# Patient Record
Sex: Male | Born: 1995 | Race: Black or African American | Hispanic: No | Marital: Single | Smoking: Never smoker
Health system: Southern US, Community
[De-identification: ages and names within clinical notes are randomized; demographics above are authoritative.]

## PROBLEM LIST (undated history)

## (undated) DIAGNOSIS — E271 Primary adrenocortical insufficiency: Secondary | ICD-10-CM

---
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
## 2015-02-13 ENCOUNTER — Encounter: Payer: Self-pay | Admitting: Emergency Medicine

## 2015-02-13 ENCOUNTER — Emergency Department: Payer: Self-pay

## 2015-02-13 ENCOUNTER — Emergency Department
Admission: EM | Admit: 2015-02-13 | Discharge: 2015-02-13 | Disposition: A | Discharge status: Home / Self Care | Payer: Self-pay | Attending: Emergency Medicine | Admitting: Emergency Medicine

## 2015-02-13 DIAGNOSIS — Y9389 Activity, other specified: Secondary | ICD-10-CM | POA: Insufficient documentation

## 2015-02-13 DIAGNOSIS — S8011XA Contusion of right lower leg, initial encounter: Secondary | ICD-10-CM

## 2015-02-13 DIAGNOSIS — W010XXA Fall on same level from slipping, tripping and stumbling without subsequent striking against object, initial encounter: Secondary | ICD-10-CM | POA: Insufficient documentation

## 2015-02-13 DIAGNOSIS — S8001XA Contusion of right knee, initial encounter: Secondary | ICD-10-CM | POA: Insufficient documentation

## 2015-02-13 DIAGNOSIS — Y990 Civilian activity done for income or pay: Secondary | ICD-10-CM | POA: Insufficient documentation

## 2015-02-13 DIAGNOSIS — S7002XA Contusion of left hip, initial encounter: Secondary | ICD-10-CM | POA: Insufficient documentation

## 2015-02-13 DIAGNOSIS — Y9289 Other specified places as the place of occurrence of the external cause: Secondary | ICD-10-CM | POA: Insufficient documentation

## 2015-02-13 DIAGNOSIS — Z7952 Long term (current) use of systemic steroids: Secondary | ICD-10-CM | POA: Insufficient documentation

## 2015-02-13 HISTORY — DX: Primary adrenocortical insufficiency: E27.1

## 2015-02-13 MED ORDER — IBUPROFEN 800 MG PO TABS: 800.0000 mg | ORAL_TABLET | Freq: Three times a day (TID) | ORAL | Status: DC | PRN

## 2015-02-13 NOTE — ED Notes (Signed)
PT slipped and fell at work and now has left hip and right knee pain, ambulatory to triage.  Does not want to file workmans comp.

## 2015-02-13 NOTE — ED Provider Notes (Signed)
[facility] Emergency Department Provider Note  ____________________________________________  Time seen: 7:27 AM  I have reviewed the triage vital signs and the nursing notes.   HISTORY  Chief Complaint Fall   HPI Ralph Ross is a 19 y.o. male states that he fell at work approximately 2:15 AM injuring his right knee and left hip. He states the floor was slippery. He does not want to follow-up with cough. He denies hitting his head or loss of consciousness. He has had problems with his right knee in the past approximately 6 months ago. Denies any hip pain previous to this injury. He has not taken any medication for this. He rates his pain 8 out of 10 at present. Walking is making it worse. Sitting decreases the pain.  Past Medical History  Diagnosis Date  . Addison disease unk    There are no active problems to display for this patient.   History reviewed. No pertinent past surgical history.  Current Outpatient Rx  Name  Route  Sig  Dispense  Refill  . predniSONE (DELTASONE) 2.5 MG tablet   Oral   Take 2.5 mg by mouth 2 (two) times daily with a meal.         . ibuprofen (ADVIL,MOTRIN) 800 MG tablet   Oral   Take 1 tablet (800 mg total) by mouth every 8 (eight) hours as needed.   30 tablet   0     Allergies Review of patient's allergies indicates no known allergies.  History reviewed. No pertinent family history.  Social History History  Substance Use Topics  . Smoking status: Never Smoker   . Smokeless tobacco: Not on file  . Alcohol Use: No    Review of Systems Constitutional: No fever/chills Eyes: No visual changes. ENT: No sore throat. Cardiovascular: Denies chest pain. Respiratory: Denies shortness of breath. Gastrointestinal: No abdominal pain.  No nausea, no vomiting. Genitourinary: Negative for dysuria. Musculoskeletal: Negative for back pain. Skin: Negative for rash. Neurological: Negative for headaches, focal  weakness or numbness.  10-point ROS otherwise negative.  ____________________________________________   PHYSICAL EXAM:  VITAL SIGNS: ED Triage Vitals  Enc Vitals Group     BP 02/13/15 0700 128/86 mmHg     Pulse Rate 02/13/15 0700 65     Resp 02/13/15 0700 18     Temp 02/13/15 0700 98 F (36.7 C)     Temp Source 02/13/15 0700 Oral     SpO2 02/13/15 0700 99 %     Weight 02/13/15 0700 165 lb (74.844 kg)     Height 02/13/15 0700 5\' 8"  (1.727 m)     Head Cir --      Peak Flow --      Pain Score 02/13/15 0701 8     Pain Loc --      Pain Edu? --      Excl. in GC? --     Constitutional: Alert and oriented. Well appearing and in no acute distress. Eyes: Conjunctivae are normal. PERRL. EOMI. Head: Atraumatic. Nose: No congestion/rhinnorhea. Mouth/Throat: Mucous membranes are moist. Neck: No stridor.   Cardiovascular: Normal rate, regular rhythm. Grossly normal heart sounds.  Good peripheral circulation. Respiratory: Normal respiratory effort.  No retractions. Lungs CTAB. Gastrointestinal: Soft and nontender. No distention. No abdominal bruits. No CVA tenderness. Musculoskeletal: Right knee negative for gross deformity. No effusion or restriction in range of motion. There is no crepitus felt.  No joint effusions. Mild tenderness on palpation of the left iliac crest.  No joint discomfort on palpation range of motion is good, no gross deformity. Patient is ambulatory. Neurologic:  Normal speech and language. No gross focal neurologic deficits are appreciated. Speech is normal. No gait instability. Skin:  Skin is warm, dry and intact. No rash noted. Psychiatric: Mood and affect are normal. Speech and behavior are normal.  ____________________________________________   LABS (all labs ordered are listed, but only abnormal results are displayed)  Labs Reviewed - No data to display   RADIOLOGY  The pelvis failed to show any fractures per radiologist. Right knee x-ray showed no  fracture or subluxation per radiologist reading. ____________________________________________   PROCEDURES  Procedure(s) performed: None  Critical Care performed: No  ____________________________________________   INITIAL IMPRESSION / ASSESSMENT AND PLAN / ED COURSE  Pertinent labs & imaging results that were available during my care of the patient were reviewed by me and considered in my medical decision making (see chart for details).  Patient was told x-rays were negative. He request a note for work today, but does not want to follow-up as called. ____________________________________________   FINAL CLINICAL IMPRESSION(S) / ED DIAGNOSES  Final diagnoses:  Contusion, knee and lower leg, right, initial encounter  Contusion, hip, left, initial encounter      Tommi RumpsRhonda L Bobie Caris, PA-C 02/13/15 1014  Governor Rooksebecca Lord, MD 02/13/15 1121

## 2015-02-13 NOTE — Discharge Instructions (Signed)
Contusion °A contusion is a deep bruise. Contusions happen when an injury causes bleeding under the skin. Signs of bruising include pain, puffiness (swelling), and discolored skin. The contusion may turn blue, purple, or yellow. °HOME CARE  °· Put ice on the injured area. °¨ Put ice in a plastic bag. °¨ Place a towel between your skin and the bag. °¨ Leave the ice on for 15-20 minutes, 03-04 times a day. °· Only take medicine as told by your doctor. °· Rest the injured area. °· If possible, raise (elevate) the injured area to lessen puffiness. °GET HELP RIGHT AWAY IF:  °· You have more bruising or puffiness. °· You have pain that is getting worse. °· Your puffiness or pain is not helped by medicine. °MAKE SURE YOU:  °· Understand these instructions. °· Will watch your condition. °· Will get help right away if you are not doing well or get worse. °Document Released: 02/29/2008 Document Revised: 12/05/2011 Document Reviewed: 07/18/2011 °Exit[employer]® Patient Information ©2015 [employer]. This information is not intended to replace advice given to you by your health care provider. Make sure you discuss any questions you have with your health care provider. ° °Cryotherapy °Cryotherapy means treatment with cold. Ice or gel packs can be used to reduce both pain and swelling. Ice is the most helpful within the first 24 to 48 hours after an injury or flare-up from overusing a muscle or joint. Sprains, strains, spasms, burning pain, shooting pain, and aches can all be eased with ice. Ice can also be used when recovering from surgery. Ice is effective, has very few side effects, and is safe for most people to use. °PRECAUTIONS  °Ice is not a safe treatment option for people with: °· Raynaud phenomenon. This is a condition affecting small blood vessels in the extremities. Exposure to cold may cause your problems to return. °· Cold hypersensitivity. There are many forms of cold hypersensitivity, including: °¨ Cold urticaria.  Red, itchy hives appear on the skin when the tissues begin to warm after being iced. °¨ Cold erythema. This is a red, itchy rash caused by exposure to cold. °¨ Cold hemoglobinuria. Red blood cells break down when the tissues begin to warm after being iced. The hemoglobin that carry oxygen are passed into the urine because they cannot combine with blood proteins fast enough. °· Numbness or altered sensitivity in the area being iced. °If you have any of the following conditions, do not use ice until you have discussed cryotherapy with your caregiver: °· Heart conditions, such as arrhythmia, angina, or chronic heart disease. °· High blood pressure. °· Healing wounds or open skin in the area being iced. °· Current infections. °· Rheumatoid arthritis. °· Poor circulation. °· Diabetes. °Ice slows the blood flow in the region it is applied. This is beneficial when trying to stop inflamed tissues from spreading irritating chemicals to surrounding tissues. However, if you expose your skin to cold temperatures for too long or without the proper protection, you can damage your skin or nerves. Watch for signs of skin damage due to cold. °HOME CARE INSTRUCTIONS °Follow these tips to use ice and cold packs safely. °· Place a dry or damp towel between the ice and skin. A damp towel will cool the skin more quickly, so you may need to shorten the time that the ice is used. °· For a more rapid response, add gentle compression to the ice. °· Ice for no more than 10 to 20 minutes at a time.   The bonier the area you are icing, the less time it will take to get the benefits of ice. °· Check your skin after 5 minutes to make sure there are no signs of a poor response to cold or skin damage. °· Rest 20 minutes or more between uses. °· Once your skin is numb, you can end your treatment. You can test numbness by very lightly touching your skin. The touch should be so light that you do not see the skin dimple from the pressure of your  fingertip. When using ice, most people will feel these normal sensations in this order: cold, burning, aching, and numbness. °· Do not use ice on someone who cannot communicate their responses to pain, such as small children or people with dementia. °HOW TO MAKE AN ICE PACK °Ice packs are the most common way to use ice therapy. Other methods include ice massage, ice baths, and cryosprays. Muscle creams that cause a cold, tingly feeling do not offer the same benefits that ice offers and should not be used as a substitute unless recommended by your caregiver. °To make an ice pack, do one of the following: °· Place crushed ice or a bag of frozen vegetables in a sealable plastic bag. Squeeze out the excess air. Place this bag inside another plastic bag. Slide the bag into a pillowcase or place a damp towel between your skin and the bag. °· Mix 3 parts water with 1 part rubbing alcohol. Freeze the mixture in a sealable plastic bag. When you remove the mixture from the freezer, it will be slushy. Squeeze out the excess air. Place this bag inside another plastic bag. Slide the bag into a pillowcase or place a damp towel between your skin and the bag. °SEEK MEDICAL CARE IF: °· You develop white spots on your skin. This may give the skin a blotchy (mottled) appearance. °· Your skin turns blue or pale. °· Your skin becomes waxy or hard. °· Your swelling gets worse. °MAKE SURE YOU:  °· Understand these instructions. °· Will watch your condition. °· Will get help right away if you are not doing well or get worse. °Document Released: 05/09/2011 Document Revised: 01/27/2014 Document Reviewed: 05/09/2011 °ExitCare® Patient Information ©2015 [employer]. This information is not intended to replace advice given to you by your health care provider. Make sure you discuss any questions you have with your health care provider. ° °

## 2015-03-08 ENCOUNTER — Emergency Department: Payer: Self-pay

## 2015-03-08 ENCOUNTER — Emergency Department
Admission: EM | Admit: 2015-03-08 | Discharge: 2015-03-08 | Disposition: A | Discharge status: Home / Self Care | Payer: Self-pay | Attending: Emergency Medicine | Admitting: Emergency Medicine

## 2015-03-08 ENCOUNTER — Encounter: Payer: Self-pay | Admitting: *Deleted

## 2015-03-08 DIAGNOSIS — Y998 Other external cause status: Secondary | ICD-10-CM | POA: Insufficient documentation

## 2015-03-08 DIAGNOSIS — S161XXA Strain of muscle, fascia and tendon at neck level, initial encounter: Secondary | ICD-10-CM | POA: Insufficient documentation

## 2015-03-08 DIAGNOSIS — Y9389 Activity, other specified: Secondary | ICD-10-CM | POA: Insufficient documentation

## 2015-03-08 DIAGNOSIS — Y92410 Unspecified street and highway as the place of occurrence of the external cause: Secondary | ICD-10-CM | POA: Insufficient documentation

## 2015-03-08 DIAGNOSIS — Z7952 Long term (current) use of systemic steroids: Secondary | ICD-10-CM | POA: Insufficient documentation

## 2015-03-08 DIAGNOSIS — S0990XA Unspecified injury of head, initial encounter: Secondary | ICD-10-CM | POA: Insufficient documentation

## 2015-03-08 MED ORDER — OXYCODONE-ACETAMINOPHEN 5-325 MG PO TABS
ORAL_TABLET | ORAL | Status: AC
  Administered 2015-03-08: 1 via ORAL
  Filled 2015-03-08: qty 1

## 2015-03-08 MED ORDER — OXYCODONE-ACETAMINOPHEN 5-325 MG PO TABS
1.0000 | ORAL_TABLET | Freq: Once | ORAL | Status: AC
  Administered 2015-03-08: 1 via ORAL

## 2015-03-08 MED ORDER — ONDANSETRON 4 MG PO TBDP
ORAL_TABLET | ORAL | Status: AC
  Administered 2015-03-08: 4 mg via ORAL
  Filled 2015-03-08: qty 1

## 2015-03-08 MED ORDER — HYDROCODONE-ACETAMINOPHEN 5-325 MG PO TABS: 1.0000 | ORAL_TABLET | Freq: Four times a day (QID) | ORAL | Status: DC | PRN

## 2015-03-08 MED ORDER — ONDANSETRON 4 MG PO TBDP
4.0000 mg | ORAL_TABLET | Freq: Once | ORAL | Status: AC
  Administered 2015-03-08: 4 mg via ORAL
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
## 2015-03-08 NOTE — Discharge Instructions (Signed)
1. Take medicines as needed for pain (Motrin/Norco #15). 2. Apply moist heat to affected area several times daily. 3. Return to the ER for worsening symptoms, persistent vomiting, difficulty breathing or other concerns.   Motor Vehicle Collision It is common to have multiple bruises and sore muscles after a motor vehicle collision (MVC). These tend to feel worse for the first 24 hours. You may have the most stiffness and soreness over the first several hours. You may also feel worse when you wake up the first morning after your collision. After this point, you will usually begin to improve with each day. The speed of improvement often depends on the severity of the collision, the number of injuries, and the location and nature of these injuries. HOME CARE INSTRUCTIONS  Put ice on the injured area.  Put ice in a plastic bag.  Place a towel between your skin and the bag.  Leave the ice on for 15-20 minutes, 3-4 times a day, or as directed by your health care provider.  Drink enough fluids to keep your urine clear or pale yellow. Do not drink alcohol.  Take a warm shower or bath once or twice a day. This will increase blood flow to sore muscles.  You may return to activities as directed by your caregiver. Be careful when lifting, as this may aggravate neck or back pain.  Only take over-the-counter or prescription medicines for pain, discomfort, or fever as directed by your caregiver. Do not use aspirin. This may increase bruising and bleeding. SEEK IMMEDIATE MEDICAL CARE IF:  You have numbness, tingling, or weakness in the arms or legs.  You develop severe headaches not relieved with medicine.  You have severe neck pain, especially tenderness in the middle of the back of your neck.  You have changes in bowel or bladder control.  There is increasing pain in any area of the body.  You have shortness of breath, light-headedness, dizziness, or fainting.  You have chest pain.  You  feel sick to your stomach (nauseous), throw up (vomit), or sweat.  You have increasing abdominal discomfort.  There is blood in your urine, stool, or vomit.  You have pain in your shoulder (shoulder strap areas).  You feel your symptoms are getting worse. MAKE SURE YOU:  Understand these instructions.  Will watch your condition.  Will get help right away if you are not doing well or get worse. Document Released: 09/12/2005 Document Revised: 01/27/2014 Document Reviewed: 02/09/2011 [facility] Patient Information 2015 [place], [place]. This information is not intended to replace advice given to you by your health care provider. Make sure you discuss any questions you have with your health care provider.  Cervical Sprain A cervical sprain is an injury in the neck in which the strong, fibrous tissues (ligaments) that connect your neck bones stretch or tear. Cervical sprains can range from mild to severe. Severe cervical sprains can cause the neck vertebrae to be unstable. This can lead to damage of the spinal cord and can result in serious nervous system problems. The amount of time it takes for a cervical sprain to get better depends on the cause and extent of the injury. Most cervical sprains heal in 1 to 3 weeks. CAUSES  Severe cervical sprains may be caused by:   Contact sport injuries (such as from football, rugby, wrestling, hockey, auto racing, gymnastics, diving, martial arts, or boxing).   Motor vehicle collisions.   Whiplash injuries. This is an injury from a sudden forward and  backward whipping movement of the head and neck.  Falls.  Mild cervical sprains may be caused by:   Being in an awkward position, such as while cradling a telephone between your ear and shoulder.   Sitting in a chair that does not offer proper support.   Working at a poorly Marketing executive station.   Looking up or down for long periods of time.  SYMPTOMS   Pain, soreness, stiffness, or a  burning sensation in the front, back, or sides of the neck. This discomfort may develop immediately after the injury or slowly, 24 hours or more after the injury.   Pain or tenderness directly in the middle of the back of the neck.   Shoulder or upper back pain.   Limited ability to move the neck.   Headache.   Dizziness.   Weakness, numbness, or tingling in the hands or arms.   Muscle spasms.   Difficulty swallowing or chewing.   Tenderness and swelling of the neck.  DIAGNOSIS  Most of the time your health care provider can diagnose a cervical sprain by taking your history and doing a physical exam. Your health care provider will ask about previous neck injuries and any known neck problems, such as arthritis in the neck. X-rays may be taken to find out if there are any other problems, such as with the bones of the neck. Other tests, such as a CT scan or MRI, may also be needed.  TREATMENT  Treatment depends on the severity of the cervical sprain. Mild sprains can be treated with rest, keeping the neck in place (immobilization), and pain medicines. Severe cervical sprains are immediately immobilized. Further treatment is done to help with pain, muscle spasms, and other symptoms and may include:  Medicines, such as pain relievers, numbing medicines, or muscle relaxants.   Physical therapy. This may involve stretching exercises, strengthening exercises, and posture training. Exercises and improved posture can help stabilize the neck, strengthen muscles, and help stop symptoms from returning.  HOME CARE INSTRUCTIONS   Put ice on the injured area.   Put ice in a plastic bag.   Place a towel between your skin and the bag.   Leave the ice on for 15-20 minutes, 3-4 times a day.   If your injury was severe, you may have been given a cervical collar to wear. A cervical collar is a two-piece collar designed to keep your neck from moving while it heals.  Do not remove the  collar unless instructed by your health care provider.  If you have long hair, keep it outside of the collar.  Ask your health care provider before making any adjustments to your collar. Minor adjustments may be required over time to improve comfort and reduce pressure on your chin or on the back of your head.  Ifyou are allowed to remove the collar for cleaning or bathing, follow your health care provider's instructions on how to do so safely.  Keep your collar clean by wiping it with mild soap and water and drying it completely. If the collar you have been given includes removable pads, remove them every 1-2 days and hand wash them with soap and water. Allow them to air dry. They should be completely dry before you wear them in the collar.  If you are allowed to remove the collar for cleaning and bathing, wash and dry the skin of your neck. Check your skin for irritation or sores. If you see any, tell your health care  provider.  Do not drive while wearing the collar.   Only take over-the-counter or prescription medicines for pain, discomfort, or fever as directed by your health care provider.   Keep all follow-up appointments as directed by your health care provider.   Keep all physical therapy appointments as directed by your health care provider.   Make any needed adjustments to your workstation to promote good posture.   Avoid positions and activities that make your symptoms worse.   Warm up and stretch before being active to help prevent problems.  SEEK MEDICAL CARE IF:   Your pain is not controlled with medicine.   You are unable to decrease your pain medicine over time as planned.   Your activity level is not improving as expected.  SEEK IMMEDIATE MEDICAL CARE IF:   You develop any bleeding.  You develop stomach upset.  You have signs of an allergic reaction to your medicine.   Your symptoms get worse.   You develop new, unexplained symptoms.   You  have numbness, tingling, weakness, or paralysis in any part of your body.  MAKE SURE YOU:   Understand these instructions.  Will watch your condition.  Will get help right away if you are not doing well or get worse. Document Released: 07/10/2007 Document Revised: 09/17/2013 Document Reviewed: 03/20/2013 [facility] Patient Information 2015 [place], [place]. This information is not intended to replace advice given to you by your health care provider. Make sure you discuss any questions you have with your health care provider.

## 2015-03-08 NOTE — ED Notes (Signed)
Patient transported to CT 
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
## 2015-06-14 ENCOUNTER — Emergency Department
Admission: EM | Admit: 2015-06-14 | Discharge: 2015-06-14 | Disposition: A | Discharge status: Home / Self Care | Payer: Self-pay | Attending: Emergency Medicine | Admitting: Emergency Medicine

## 2015-06-14 DIAGNOSIS — X58XXXA Exposure to other specified factors, initial encounter: Secondary | ICD-10-CM | POA: Insufficient documentation

## 2015-06-14 DIAGNOSIS — Y92310 Basketball court as the place of occurrence of the external cause: Secondary | ICD-10-CM | POA: Insufficient documentation

## 2015-06-14 DIAGNOSIS — S299XXA Unspecified injury of thorax, initial encounter: Secondary | ICD-10-CM | POA: Insufficient documentation

## 2015-06-14 DIAGNOSIS — Y990 Civilian activity done for income or pay: Secondary | ICD-10-CM | POA: Insufficient documentation

## 2015-06-14 DIAGNOSIS — Y9367 Activity, basketball: Secondary | ICD-10-CM | POA: Insufficient documentation

## 2015-06-14 DIAGNOSIS — S46911A Strain of unspecified muscle, fascia and tendon at shoulder and upper arm level, right arm, initial encounter: Secondary | ICD-10-CM | POA: Insufficient documentation

## 2015-06-14 DIAGNOSIS — Z79899 Other long term (current) drug therapy: Secondary | ICD-10-CM | POA: Insufficient documentation

## 2015-06-14 MED ORDER — NAPROXEN 500 MG PO TBEC: 500.0000 mg | DELAYED_RELEASE_TABLET | Freq: Two times a day (BID) | ORAL | Status: DC

## 2015-06-14 NOTE — Discharge Instructions (Signed)
Shoulder Sprain  A shoulder sprain is the result of damage to the tough, fiber-like tissues (ligaments) that help hold your shoulder in place. The ligaments may be stretched or torn. Besides the main shoulder joint (the ball and socket), there are several smaller joints that connect the bones in this area. A sprain usually involves one of those joints. Most often it is the acromioclavicular (or AC) joint. That is the joint that connects the collarbone (clavicle) and the shoulder blade (scapula) at the top point of the shoulder blade (acromion).  A shoulder sprain is a mild form of what is called a shoulder separation. Recovering from a shoulder sprain may take some time. For some, pain lingers for several months. Most people recover without long term problems.  CAUSES    A shoulder sprain is usually caused by some kind of trauma. This might be:   Falling on an outstretched arm.   Being hit hard on the shoulder.   Twisting the arm.   Shoulder sprains are more likely to occur in people who:   Play sports.   Have balance or coordination problems.  SYMPTOMS    Pain when you move your shoulder.   Limited ability to move the shoulder.   Swelling and tenderness on top of the shoulder.   Redness or warmth in the shoulder.   Bruising.   A change in the shape of the shoulder.  DIAGNOSIS   Your healthcare provider may:   Ask about your symptoms.   Ask about recent activity that might have caused those symptoms.   Examine your shoulder. You may be asked to do simple exercises to test movement. The other shoulder will be examined for comparison.   Order some tests that provide a look inside the body. They can show the extent of the injury. The tests could include:   X-rays.   CT (computed tomography) scan.   MRI (magnetic resonance imaging) scan.  RISKS AND COMPLICATIONS   Loss of full shoulder motion.   Ongoing shoulder pain.  TREATMENT   How long it takes to recover from a shoulder sprain depends on how  severe it was. Treatment options may include:   Rest. You should not use the arm or shoulder until it heals.   Ice. For 2 or 3 days after the injury, put an ice pack on the shoulder up to 4 times a day. It should stay on for 15 to 20 minutes each time. Wrap the ice in a towel so it does not touch your skin.   Over-the-counter medicine to relieve pain.   A sling or brace. This will keep the arm still while the shoulder is healing.   Physical therapy or rehabilitation exercises. These will help you regain strength and motion. Ask your healthcare provider when it is OK to begin these exercises.   Surgery. The need for surgery is rare with a sprained shoulder, but some people may need surgery to keep the joint in place and reduce pain.  HOME CARE INSTRUCTIONS    Ask your healthcare provider about what you should and should not do while your shoulder heals.   Make sure you know how to apply ice to the correct area of your shoulder.   Talk with your healthcare provider about which medications should be used for pain and swelling.   If rehabilitation therapy will be needed, ask your healthcare provider to refer you to a therapist. If it is not recommended, then ask about at-home exercises. Find   Revised: 12/05/2011 Document Reviewed: 01/29/2009 [employer] Patient Information 2015 [place], [place]. This information is not intended to replace advice given to you by your health care provider. Make sure you discuss any questions you have with your health care provider.  Muscle Strain A muscle strain is an injury that occurs when a muscle is stretched beyond its normal length. Usually a small number of muscle fibers are torn when this happens. Muscle strain is rated in  degrees. First-degree strains have the least amount of muscle fiber tearing and pain. Second-degree and third-degree strains have increasingly more tearing and pain.  Usually, recovery from muscle strain takes 1-2 weeks. Complete healing takes 5-6 weeks.  CAUSES  Muscle strain happens when a sudden, violent force placed on a muscle stretches it too far. This may occur with lifting, sports, or a fall.  RISK FACTORS Muscle strain is especially common in athletes.  SIGNS AND SYMPTOMS At the site of the muscle strain, there may be:  Pain.  Bruising.  Swelling.  Difficulty using the muscle due to pain or lack of normal function. DIAGNOSIS  Your health care provider will perform a physical exam and ask about your medical history. TREATMENT  Often, the best treatment for a muscle strain is resting, icing, and applying cold compresses to the injured area.  HOME CARE INSTRUCTIONS   Use the PRICE method of treatment to promote muscle healing during the first 2-3 days after your injury. The PRICE method involves:  Protecting the muscle from being injured again.  Restricting your activity and resting the injured body part.  Icing your injury. To do this, put ice in a plastic bag. Place a towel between your skin and the bag. Then, apply the ice and leave it on from 15-20 minutes each hour. After the third day, switch to moist heat packs.  Apply compression to the injured area with a splint or elastic bandage. Be careful not to wrap it too tightly. This may interfere with blood circulation or increase swelling.  Elevate the injured body part above the level of your heart as often as you can.  Only take over-the-counter or prescription medicines for pain, discomfort, or fever as directed by your health care provider.  Warming up prior to exercise helps to prevent future muscle strains. SEEK MEDICAL CARE IF:   You have increasing pain or swelling in the injured area.  You have numbness,  tingling, or a significant loss of strength in the injured area. MAKE SURE YOU:   Understand these instructions.  Will watch your condition.  Will get help right away if you are not doing well or get worse. Document Released: 09/12/2005 Document Revised: 07/03/2013 Document Reviewed: 04/11/2013 [facility] Patient Information 2015 [place], [place]. This information is not intended to replace advice given to you by your health care provider. Make sure you discuss any questions you have with your health care provider.  Your exam is normal except for some mild muscle strain and tenderness over the trapezius and shoulder blade muscles.  You should apply ice and take the prescription Naproxen as directed.  Follow-up with [employer] as needed.

## 2015-06-14 NOTE — ED Notes (Signed)
Pt c/o right anterior shoulder pain and tingling. Onset 3 days ago while at work.   He reports that he played basketball the day before the pain started but does not remember a specific injury.  Last night patients reports that he had a shooting pain from his shoulder to right side of neck.

## 2015-06-14 NOTE — ED Provider Notes (Signed)
[facility] Emergency Department Provider Note ____________________________________________  Time seen: 1601  I have reviewed the triage vital signs and the nursing notes.  HISTORY  Chief Complaint  Shoulder Pain  HPI Ralph Ross is a 19 y.o. male reports to the ED with anterior right shoulder pain and tingling. He describes onset about 3 days prior while at work. He denies any particular injury, trauma, fall. He does not give a history of shoulder pain in the past.He localizes to pain to the trapezius and upper scapular region on the right side of his shoulder. He has applied heat for comfort without significant change in his symptoms. He works for [employer], Occupational hygienist.   Past Medical History  Diagnosis Date  . Addison disease unk   There are no active problems to display for this patient.  No past surgical history on file.  Current Outpatient Rx  Name  Route  Sig  Dispense  Refill  . fludrocortisone (FLORINEF) 0.1 MG tablet   Oral   Take 0.1 mg by mouth daily.         Marland Kitchen HYDROcodone-acetaminophen (NORCO) 5-325 MG per tablet   Oral   Take 1 tablet by mouth every 6 (six) hours as needed for moderate pain.   15 tablet   0   . ibuprofen (ADVIL,MOTRIN) 800 MG tablet   Oral   Take 1 tablet (800 mg total) by mouth every 8 (eight) hours as needed. Patient not taking: Reported on 03/08/2015   30 tablet   0   . naproxen (EC NAPROSYN) 500 MG EC tablet   Oral   Take 1 tablet (500 mg total) by mouth 2 (two) times daily with a meal.   30 tablet   0   . predniSONE (DELTASONE) 2.5 MG tablet   Oral   Take 2.5 mg by mouth 2 (two) times daily with a meal.          Allergies Review of patient's allergies indicates no known allergies.  No family history on file.  Social History Social History  Substance Use Topics  . Smoking status: Never Smoker   . Smokeless tobacco: Never Used  . Alcohol Use: No   Review of  Systems  Constitutional: Negative for fever. Eyes: Negative for visual changes. ENT: Negative for sore throat. Cardiovascular: Negative for chest pain. Respiratory: Negative for shortness of breath. Gastrointestinal: Negative for abdominal pain, vomiting and diarrhea. Genitourinary: Negative for dysuria. Musculoskeletal: Positive for upper back pain. Skin: Negative for rash. Neurological: Negative for headaches, focal weakness or numbness. ____________________________________________  PHYSICAL EXAM:  VITAL SIGNS: ED Triage Vitals  Enc Vitals Group     BP 06/14/15 1620 146/99 mmHg     Pulse Rate 06/14/15 1620 61     Resp 06/14/15 1620 18     Temp 06/14/15 1620 97.8 F (36.6 C)     Temp Source 06/14/15 1620 Oral     SpO2 06/14/15 1620 98 %     Weight 06/14/15 1620 158 lb (71.668 kg)     Height 06/14/15 1620  (1.727 m)     Head Cir --      Peak Flow --      Pain Score 06/14/15 1620 4     Pain Loc --      Pain Edu? --      Excl. in GC? --    Constitutional: Alert and oriented. Well appearing and in no distress. Eyes: Conjunctivae are normal. PERRL. Normal extraocular  movements. ENT   Head: Normocephalic and atraumatic.   Nose: No congestion/rhinorrhea.   Mouth/Throat: Mucous membranes are moist.   Neck: Supple. No thyromegaly. Hematological/Lymphatic/Immunological: No cervical lymphadenopathy. Cardiovascular: Normal rate, regular rhythm.  Respiratory: Normal respiratory effort. No wheezes/rales/rhonchi. Gastrointestinal: Soft and nontender. No distention. Musculoskeletal: Normal spinal alignment without spasm, deformity, or step-off. Evaluation of the chest and shortness does not reveal any obvious deformity, swelling, or scapular winging. Patient with full normal range of the right upper extremity without difficulty. Normal rotator cuff testing is elicited. He is minimally tender to palpation over the right upper trapezius as well as the supraspinous  musculature. Normal composite fist and grips distally. Nontender with normal range of motion in all extremities.  Neurologic:  Normal gait without ataxia. Normal speech and language. No gross focal neurologic deficits are appreciated. Skin:  Skin is warm, dry and intact. No rash noted. Psychiatric: Mood and affect are normal. Patient exhibits appropriate insight and judgment. ________________________________________________________  INITIAL IMPRESSION / ASSESSMENT AND PLAN / ED COURSE  Right anterior shoulder scapular strain without indication of rotator cuff injury. 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 Follow-up with [employer] as needed.  ____________________________________________  FINAL CLINICAL IMPRESSION(S) / ED DIAGNOSES  Final diagnoses:  Shoulder strain, right, initial encounter      Lissa Hoard, PA-C 06/14/15 1749  Emily Filbert, MD 06/14/15 [phone]

## 2015-11-16 ENCOUNTER — Emergency Department
Admission: EM | Admit: 2015-11-16 | Discharge: 2015-11-16 | Disposition: A | Discharge status: Home / Self Care | Payer: No Typology Code available for payment source | Attending: Emergency Medicine | Admitting: Emergency Medicine

## 2015-11-16 ENCOUNTER — Encounter: Payer: Self-pay | Admitting: Emergency Medicine

## 2015-11-16 DIAGNOSIS — Z791 Long term (current) use of non-steroidal anti-inflammatories (NSAID): Secondary | ICD-10-CM | POA: Insufficient documentation

## 2015-11-16 DIAGNOSIS — E2740 Unspecified adrenocortical insufficiency: Secondary | ICD-10-CM | POA: Insufficient documentation

## 2015-11-16 DIAGNOSIS — Z79899 Other long term (current) drug therapy: Secondary | ICD-10-CM | POA: Insufficient documentation

## 2015-11-16 DIAGNOSIS — Z7952 Long term (current) use of systemic steroids: Secondary | ICD-10-CM | POA: Insufficient documentation

## 2015-11-16 LAB — COMPREHENSIVE METABOLIC PANEL
ALK PHOS: 95 U/L (ref 38–126)
ALT: 16 U/L — AB (ref 17–63)
AST: 22 U/L (ref 15–41)
Albumin: 4 g/dL (ref 3.5–5.0)
Anion gap: 5 (ref 5–15)
BUN: 15 mg/dL (ref 6–20)
CALCIUM: 9.2 mg/dL (ref 8.9–10.3)
CHLORIDE: 108 mmol/L (ref 101–111)
CO2: 27 mmol/L (ref 22–32)
Creatinine, Ser: 0.84 mg/dL (ref 0.61–1.24)
GFR calc Af Amer: >60 mL/min (ref >60)
Glucose, Bld: 105 mg/dL — ABNORMAL HIGH (ref 65–99)
Potassium: 3.9 mmol/L (ref 3.5–5.1)
Sodium: 140 mmol/L (ref 135–145)
Total Bilirubin: 0.4 mg/dL (ref 0.3–1.2)
Total Protein: 6.7 g/dL (ref 6.5–8.1)

## 2015-11-16 LAB — CBC
HCT: 41.6 % (ref 40.0–52.0)
Hemoglobin: 14.2 g/dL (ref 13.0–18.0)
MCH: 30.6 pg (ref 26.0–34.0)
MCHC: 34.2 g/dL (ref 32.0–36.0)
MCV: 89.4 fL (ref 80.0–100.0)
Platelets: 205 10*3/uL (ref 150–440)
RBC: 4.66 MIL/uL (ref 4.40–5.90)
RDW: 13.3 % (ref 11.5–14.5)
WBC: 7.3 10*3/uL (ref 3.8–10.6)

## 2015-11-16 LAB — LIPASE, BLOOD: LIPASE: 24 U/L (ref 11–51)

## 2015-11-16 MED ORDER — PREDNISONE 5 MG PO TABS
7.5000 mg | ORAL_TABLET | ORAL | Status: AC
  Administered 2015-11-16: 7.5 mg via ORAL
  Filled 2015-11-16: qty 1

## 2015-11-16 MED ORDER — FLUDROCORTISONE ACETATE 0.1 MG PO TABS
0.1000 mg | ORAL_TABLET | ORAL | Status: AC
  Administered 2015-11-16: 0.1 mg via ORAL
  Filled 2015-11-16: qty 1

## 2015-11-16 MED ORDER — SODIUM CHLORIDE 0.9 % IV BOLUS (SEPSIS)
1000.0000 mL | Freq: Once | INTRAVENOUS | Status: AC
  Administered 2015-11-16: 1000 mL via INTRAVENOUS

## 2015-11-16 NOTE — Discharge Instructions (Signed)
Please follow up closely with [facility] endocrinology. Please call your physician there to set up close follow-up.  Please increase your dose of prednisone to 7.5 mg for the next 48 hours. Then continue your normal dosing.  Please take your Florinef as previously prescribed.  Return to the emergency room right away if you develop a fever, vomiting, weakness, abdominal pain, feel your symptoms such as severe nausea or weakness returning, or other new concerns arise.

## 2015-11-16 NOTE — ED Provider Notes (Signed)
[facility] Emergency Department Provider Note  ____________________________________________  Time seen: Approximately 11:39 PM  I have reviewed the triage vital signs and the nursing notes.   HISTORY  Chief Complaint Emesis    HPI Ralph Ross is a 20 y.o. male history of primary adrenal insufficiency. Patient reports that he was out of medicine for 2 days because he visited [place]. He did not take his medicines with him this morning he started to develop nausea. No pain. No fevers chills chest pain and trouble breathing. He otherwise feels okay except for feeling generally fatigued. He did vomit once.  Denies abdominal pain.  Follows with [facility] pediatric endocrinology.  Past Medical History  Diagnosis Date  . Addison disease (HCC) unk    There are no active problems to display for this patient.   History reviewed. No pertinent past surgical history.  Current Outpatient Rx  Name  Route  Sig  Dispense  Refill  . fludrocortisone (FLORINEF) 0.1 MG tablet   Oral   Take 0.1 mg by mouth daily.         Marland Kitchen HYDROcodone-acetaminophen (NORCO) 5-325 MG per tablet   Oral   Take 1 tablet by mouth every 6 (six) hours as needed for moderate pain.   15 tablet   0   . ibuprofen (ADVIL,MOTRIN) 800 MG tablet   Oral   Take 1 tablet (800 mg total) by mouth every 8 (eight) hours as needed. Patient not taking: Reported on 03/08/2015   30 tablet   0   . naproxen (EC NAPROSYN) 500 MG EC tablet   Oral   Take 1 tablet (500 mg total) by mouth 2 (two) times daily with a meal.   30 tablet   0   . predniSONE (DELTASONE) 2.5 MG tablet   Oral   Take 2.5 mg by mouth 2 (two) times daily with a meal.           Allergies Review of patient's allergies indicates no known allergies.  History reviewed. No pertinent family history.  Social History Social History  Substance Use Topics  . Smoking status: Never Smoker   . Smokeless tobacco: Never Used  .  Alcohol Use: No    Review of Systems Constitutional: No fever/chills Eyes: No visual changes. ENT: No sore throat. Cardiovascular: Denies chest pain. Respiratory: Denies shortness of breath. Gastrointestinal: No abdominal pain.   No diarrhea.  No constipation. Genitourinary: Negative for dysuria. Musculoskeletal: Negative for back pain. Skin: Negative for rash. Neurological: Negative for headaches, focal weakness or numbness.  10-point ROS otherwise negative.  ____________________________________________   PHYSICAL EXAM:  VITAL SIGNS: ED Triage Vitals  Enc Vitals Group     BP 11/16/15 2120 119/73 mmHg     Pulse Rate 11/16/15 2120 57     Resp 11/16/15 2120 18     Temp 11/16/15 2120 98.7 F (37.1 C)     Temp Source 11/16/15 2120 Oral     SpO2 11/16/15 2120 97 %     Weight 11/16/15 2120 162 lb (73.483 kg)     Height 11/16/15 2120  (1.727 m)     Head Cir --      Peak Flow --      Pain Score 11/16/15 2121 7     Pain Loc --      Pain Edu? --      Excl. in GC? --    Constitutional: Alert and oriented. Well appearing and in no acute distress. Patient reports  he feels better and would like something to eat. Eyes: Conjunctivae are normal. PERRL. EOMI. Head: Atraumatic. Nose: No congestion/rhinnorhea. Mouth/Throat: Mucous membranes are moist.  Oropharynx non-erythematous. Neck: No stridor.   Cardiovascular: Normal rate, regular rhythm. Grossly normal heart sounds.  Good peripheral circulation. Respiratory: Normal respiratory effort.  No retractions. Lungs CTAB. Gastrointestinal: Soft and nontender. No distention. No rebound or guarding. No epigastric pain. Patient denies any abdominal pain on exam at this time. No right lower quadrant pain. Musculoskeletal: No lower extremity tenderness nor edema.   Neurologic:  Normal speech and language. No gross focal neurologic deficits are appreciated. Skin:  Skin is warm, dry and intact. No rash noted. Psychiatric: Mood and affect  are normal. Speech and behavior are normal.  ____________________________________________   LABS (all labs ordered are listed, but only abnormal results are displayed)  Labs Reviewed  COMPREHENSIVE METABOLIC PANEL - Abnormal; Notable for the following:    Glucose, Bld 105 (*)    ALT 16 (*)    All other components within normal limits  CBC  LIPASE, BLOOD   ____________________________________________  EKG   ____________________________________________  RADIOLOGY   ____________________________________________   PROCEDURES  Procedure(s) performed: None  Critical Care performed: No  ____________________________________________   INITIAL IMPRESSION / ASSESSMENT AND PLAN / ED COURSE  Pertinent labs & imaging results that were available during my care of the patient were reviewed by me and considered in my medical decision making (see chart for details).  Patient presents for nausea with one episode of vomiting and generalized fatigue after missing his steroids for the last 48 hours. He did not run out of medicine but simply did not bring them with him when he traveled. He is followed by pediatric endocrinology, I discussed his case with Dr. Laveda Norman the on-call endocrinologist. He advises symptoms do appear to be consistent with missing his medications advises tripling prednisone dose for the next 48 hours and maintaining his normal Florinef dosing.  Labs reviewed and are reassuring. Patient given Florinef and prednisone. Patient reports all of his symptoms have resolved and he feels well at this time.  Plan of care, medication adjustment for the next 48 hours discussed with patient and mother are both agreeable. He will call [facility] endocrinology for close follow-up. Careful return precautions advised.  No evidence to support need for CT imaging. No fever. No right lower quadrant tenderness. No peritonitis. He reports all symptoms  resolved ____________________________________________   FINAL CLINICAL IMPRESSION(S) / ED DIAGNOSES  Final diagnoses:  Adrenal insufficiency (HCC)      Sharyn Creamer, MD 11/16/15 2348

## 2015-11-16 NOTE — ED Notes (Signed)
Pt arrived to the ED for complaints of generalized malaise, vomiting and weakness secondary to not taking his hormone replacement medication for 2 days. Pt's mother states that the Pt suffers of congenital adrenal hypoplasia and has not take his medication in 2 days and they are afraid that the Pt is going into an adrenal crisis. Pt is AOx4 feeling tired in triage.

## 2015-12-04 ENCOUNTER — Emergency Department: Payer: Self-pay

## 2015-12-04 ENCOUNTER — Emergency Department
Admission: EM | Admit: 2015-12-04 | Discharge: 2015-12-04 | Disposition: A | Discharge status: Home / Self Care | Payer: Self-pay | Attending: Emergency Medicine | Admitting: Emergency Medicine

## 2015-12-04 ENCOUNTER — Encounter: Payer: Self-pay | Admitting: Emergency Medicine

## 2015-12-04 DIAGNOSIS — E2740 Unspecified adrenocortical insufficiency: Secondary | ICD-10-CM | POA: Insufficient documentation

## 2015-12-04 DIAGNOSIS — M25561 Pain in right knee: Secondary | ICD-10-CM | POA: Insufficient documentation

## 2015-12-04 MED ORDER — MELOXICAM 15 MG PO TABS: 15.0000 mg | ORAL_TABLET | Freq: Every day | ORAL | Status: DC

## 2015-12-04 NOTE — Discharge Instructions (Signed)
How to Use a Knee Brace °A knee brace is a device that you wear to support your knee, especially if the knee is healing after an injury or surgery. There are several types of knee braces. Some are designed to prevent an injury (prophylactic brace). These are often worn during sports. Others support an injured knee (functional brace) or keep it still while it heals (rehabilitative brace). People with severe arthritis of the knee may benefit from a brace that takes some pressure off the knee (unloader brace). Most knee braces are made from a combination of cloth and metal or plastic.  °You may need to wear a knee brace to: °· Relieve knee pain. °· Help your knee support your weight (improve stability). °· Help you walk farther (improve mobility). °· Prevent injury. °· Support your knee while it heals from surgery or from an injury. °RISKS AND COMPLICATIONS °Generally, knee braces are very safe to wear. However, problems may occur, including: °· Skin irritation that may lead to infection. °· Making your condition worse if you wear the brace in the wrong way. °HOW TO USE A KNEE BRACE °Different braces will have different instructions for use. Your health care provider will tell you or show you: °· How to put on your brace. °· How to adjust the brace. °· When and how often to wear the brace. °· How to remove the brace. °· If you will need any assistive devices in addition to the brace, such as crutches or a cane. °In general, your brace should: °· Have the hinge of the brace line up with the bend of your knee. °· Have straps, hooks, or tapes that fasten snugly around your leg. °· Not feel too tight or too loose.  °HOW TO CARE FOR A KNEE BRACE °· Check your brace often for signs of damage, such as loose connections or attachments. Your knee brace may get damaged or wear out during normal use. °· Wash the fabric parts of your brace with soap and water. °· Read the insert that comes with your brace for other specific care  instructions.  °SEEK MEDICAL CARE IF: °· Your knee brace is too loose or too tight and you cannot adjust it. °· Your knee brace causes skin redness, swelling, bruising, or irritation. °· Your knee brace is not helping. °· Your knee brace is making your knee pain worse. °  °This information is not intended to replace advice given to you by your health care provider. Make sure you discuss any questions you have with your health care provider. °  °Document Released: 12/03/2003 Document Revised: 06/03/2015 Document Reviewed: 01/05/2015 °Elsevier Interactive Patient Education ©2016 [employer] °Knee Pain °Knee pain is a very common symptom and can have many causes. Knee pain often goes away when you follow your health care provider's instructions for relieving pain and discomfort at home. However, knee pain can develop into a condition that needs treatment. Some conditions may include: °· Arthritis caused by wear and tear (osteoarthritis). °· Arthritis caused by swelling and irritation (rheumatoid arthritis or gout). °· A cyst or growth in your knee. °· An infection in your knee joint. °· An injury that will not heal. °· Damage, swelling, or irritation of the tissues that support your knee (torn ligaments or tendinitis). °If your knee pain continues, additional tests may be ordered to diagnose your condition. Tests may include X-rays or other imaging studies of your knee. You may also need to have fluid removed from your knee. Treatment for ongoing   knee pain depends on the cause, but treatment may include: °· Medicines to relieve pain or swelling. °· Steroid injections in your knee. °· Physical therapy. °· Surgery. °HOME CARE INSTRUCTIONS °· Take medicines only as directed by your health care provider. °· Rest your knee and keep it raised (elevated) while you are resting. °· Do not do things that cause or worsen pain. °· Avoid high-impact activities or exercises, such as running, jumping rope, or doing jumping  jacks. °· Apply ice to the knee area: °¨ Put ice in a plastic bag. °¨ Place a towel between your skin and the bag. °¨ Leave the ice on for 20 minutes, 2-3 times a day. °· Ask your health care provider if you should wear an elastic knee support. °· Keep a pillow under your knee when you sleep. °· Lose weight if you are overweight. Extra weight can put pressure on your knee. °· Do not use any tobacco products, including cigarettes, chewing tobacco, or electronic cigarettes. If you need help quitting, ask your health care provider. Smoking may slow the healing of any bone and joint problems that you may have. °SEEK MEDICAL CARE IF: °· Your knee pain continues, changes, or gets worse. °· You have a fever along with knee pain. °· Your knee buckles or locks up. °· Your knee becomes more swollen. °SEEK IMMEDIATE MEDICAL CARE IF:  °· Your knee joint feels hot to the touch. °· You have chest pain or trouble breathing. °  °This information is not intended to replace advice given to you by your health care provider. Make sure you discuss any questions you have with your health care provider. °  °Document Released: 07/10/2007 Document Revised: 10/03/2014 Document Reviewed: 04/28/2014 °Elsevier Interactive Patient Education ©2016 Elsevier Inc. ° °

## 2015-12-04 NOTE — ED Provider Notes (Signed)
[facility] Emergency Department Provider Note  ____________________________________________  Time seen: Approximately 8:03 PM  I have reviewed the triage vital signs and the nursing notes.   HISTORY  Chief Complaint Leg Pain    HPI Ralph Ross is a 20 y.o. male who presents emergency department complaining of right knee pain. Patient states that he had a dislocated patella "a few days ago." Patient states that he reduced it at home. He is continuing to have pain just proximal to the right knee. He states it does hurt with ambulation but he is able to walk on the affected extremity. Denies any distal numbness or tingling denies any other injury or complaint.   Past Medical History  Diagnosis Date  . Addison disease (HCC) unk    There are no active problems to display for this patient.   History reviewed. No pertinent past surgical history.  Current Outpatient Rx  Name  Route  Sig  Dispense  Refill  . fludrocortisone (FLORINEF) 0.1 MG tablet   Oral   Take 0.1 mg by mouth daily.         Marland Kitchen. HYDROcodone-acetaminophen (NORCO) 5-325 MG per tablet   Oral   Take 1 tablet by mouth every 6 (six) hours as needed for moderate pain.   15 tablet   0   . ibuprofen (ADVIL,MOTRIN) 800 MG tablet   Oral   Take 1 tablet (800 mg total) by mouth every 8 (eight) hours as needed. Patient not taking: Reported on 03/08/2015   30 tablet   0   . meloxicam (MOBIC) 15 MG tablet   Oral   Take 1 tablet (15 mg total) by mouth daily.   30 tablet   0   . naproxen (EC NAPROSYN) 500 MG EC tablet   Oral   Take 1 tablet (500 mg total) by mouth 2 (two) times daily with a meal.   30 tablet   0   . predniSONE (DELTASONE) 2.5 MG tablet   Oral   Take 2.5 mg by mouth 2 (two) times daily with a meal.           Allergies Review of patient's allergies indicates no known allergies.  No family history on file.  Social History Social History  Substance Use  Topics  . Smoking status: Never Smoker   . Smokeless tobacco: Never Used  . Alcohol Use: No     Review of Systems  Constitutional: No fever/chills Cardiovascular: no chest pain. Respiratory: no cough. No SOB. Musculoskeletal: Negative for back pain. Positive for right knee pain. Skin: Negative for rash. Neurological: Negative for headaches, focal weakness or numbness. 10-point ROS otherwise negative.  ____________________________________________   PHYSICAL EXAM:  VITAL SIGNS: ED Triage Vitals  Enc Vitals Group     BP 12/04/15 1731 123/65 mmHg     Pulse Rate 12/04/15 1731 77     Resp --      Temp 12/04/15 1731 97.8 F (36.6 C)     Temp Source 12/04/15 1731 Oral     SpO2 12/04/15 1731 100 %     Weight 12/04/15 1731 160 lb (72.576 kg)     Height 12/04/15 1731 5\' 8"  (1.727 m)     Head Cir --      Peak Flow --      Pain Score 12/04/15 1732 4     Pain Loc --      Pain Edu? --      Excl. in GC? --  Constitutional: Alert and oriented. Well appearing and in no acute distress. Cardiovascular: Normal rate, regular rhythm. Normal S1 and S2.  Good peripheral circulation. Respiratory: Normal respiratory effort without tachypnea or retractions. Lungs CTAB. Musculoskeletal: No visible deformity to right knee when compared to left. Full range of motion and knee. Diffuse tenderness to palpation over the anterior knee. No point tenderness. Varus, valgus, Lachman's, McMurray's are negative. Dorsalis pedis pulses appreciated distally. Sensation intact distally and equal to affected extremity. Neurologic:  Normal speech and language. No gross focal neurologic deficits are appreciated.  Skin:  Skin is warm, dry and intact. No rash noted. Psychiatric: Mood and affect are normal. Speech and behavior are normal. Patient exhibits appropriate insight and judgement.   ____________________________________________   LABS (all labs ordered are listed, but only abnormal results are  displayed)  Labs Reviewed - No data to display ____________________________________________  EKG   ____________________________________________  RADIOLOGY Festus Barren Cuthriell, personally viewed and evaluated these images (plain radiographs) as part of my medical decision making, as well as reviewing the written report by the radiologist.  Dg Knee Complete 4 Views Right  12/04/2015  CLINICAL DATA:  Reported patellar dislocation with patient reduction 3 days prior. Persistent right knee pain and numbness. EXAM: RIGHT KNEE - COMPLETE 4+ VIEW COMPARISON:  Radiographs 02/13/2015 FINDINGS: No fracture or dislocation. The patellar is normally seated. No evidence of patellar fracture. There is a small suprapatellar joint effusion. The growth plates are fusing. 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 IMPRESSION: 1. No fracture or dislocation.  The patella is normally located. 2. Small joint effusion. Electronically Signed   By: Rubye Oaks M.D.   On: 12/04/2015 20:26    ____________________________________________    PROCEDURES  Procedure(s) performed:       Medications - No data to display   ____________________________________________   INITIAL IMPRESSION / ASSESSMENT AND PLAN / ED COURSE  Pertinent labs & imaging results that were available during my care of the patient were reviewed by me and considered in my medical decision making (see chart for details).  Patient's diagnosis is consistent with right knee pain. X-ray reveals no acute osseous abnormality. Exam is reassuring.. Patient is given anti-inflammatories for symptom reduction. He will follow up with orthopedics if symptoms persist past this treatment course..  Patient is given ED precautions to return to the ED for any worsening or new  symptoms.     ____________________________________________  FINAL CLINICAL IMPRESSION(S) / ED DIAGNOSES  Final diagnoses:  Right knee pain      NEW MEDICATIONS STARTED DURING THIS VISIT:  New Prescriptions   MELOXICAM (MOBIC) 15 MG TABLET    Take 1 tablet (15 mg total) by mouth daily.        This chart was dictated using voice recognition software/Dragon. Despite best efforts to proofread, errors can occur which can change the meaning. Any change was purely unintentional.    Racheal Patches, PA-C 12/04/15 2145  Phineas Semen, MD 12/04/15 2315

## 2015-12-04 NOTE — ED Notes (Signed)
Patient states he dislocated right patella 3 days ago. Popped it back in himself at home. Patient states he has numbness to area just above right knee since then. No difficulty ambulating.

## 2015-12-07 ENCOUNTER — Encounter: Payer: Self-pay | Admitting: Emergency Medicine

## 2015-12-07 ENCOUNTER — Emergency Department: Payer: No Typology Code available for payment source

## 2015-12-07 ENCOUNTER — Emergency Department
Admission: EM | Admit: 2015-12-07 | Discharge: 2015-12-07 | Disposition: A | Discharge status: Home / Self Care | Payer: No Typology Code available for payment source | Attending: Emergency Medicine | Admitting: Emergency Medicine

## 2015-12-07 DIAGNOSIS — Z79899 Other long term (current) drug therapy: Secondary | ICD-10-CM | POA: Insufficient documentation

## 2015-12-07 DIAGNOSIS — S199XXA Unspecified injury of neck, initial encounter: Secondary | ICD-10-CM | POA: Diagnosis present

## 2015-12-07 DIAGNOSIS — E271 Primary adrenocortical insufficiency: Secondary | ICD-10-CM | POA: Insufficient documentation

## 2015-12-07 DIAGNOSIS — Y998 Other external cause status: Secondary | ICD-10-CM | POA: Insufficient documentation

## 2015-12-07 DIAGNOSIS — Z7952 Long term (current) use of systemic steroids: Secondary | ICD-10-CM | POA: Insufficient documentation

## 2015-12-07 DIAGNOSIS — S0990XA Unspecified injury of head, initial encounter: Secondary | ICD-10-CM | POA: Diagnosis not present

## 2015-12-07 DIAGNOSIS — Y92410 Unspecified street and highway as the place of occurrence of the external cause: Secondary | ICD-10-CM | POA: Diagnosis not present

## 2015-12-07 DIAGNOSIS — S161XXA Strain of muscle, fascia and tendon at neck level, initial encounter: Secondary | ICD-10-CM | POA: Insufficient documentation

## 2015-12-07 DIAGNOSIS — Y9389 Activity, other specified: Secondary | ICD-10-CM | POA: Diagnosis not present

## 2015-12-07 DIAGNOSIS — Z791 Long term (current) use of non-steroidal anti-inflammatories (NSAID): Secondary | ICD-10-CM | POA: Diagnosis not present

## 2015-12-07 MED ORDER — METHOCARBAMOL 750 MG PO TABS: 750.0000 mg | ORAL_TABLET | Freq: Four times a day (QID) | ORAL | Status: DC

## 2015-12-07 MED ORDER — IBUPROFEN 800 MG PO TABS: 800.0000 mg | ORAL_TABLET | Freq: Three times a day (TID) | ORAL | Status: DC | PRN
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
## 2015-12-07 NOTE — Discharge Instructions (Signed)
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
## 2016-02-29 ENCOUNTER — Emergency Department: Payer: No Typology Code available for payment source

## 2016-02-29 ENCOUNTER — Emergency Department
Admission: EM | Admit: 2016-02-29 | Discharge: 2016-02-29 | Disposition: A | Discharge status: Home / Self Care | Payer: No Typology Code available for payment source | Attending: Emergency Medicine | Admitting: Emergency Medicine

## 2016-02-29 ENCOUNTER — Encounter: Payer: Self-pay | Admitting: Emergency Medicine

## 2016-02-29 DIAGNOSIS — S92501A Displaced unspecified fracture of right lesser toe(s), initial encounter for closed fracture: Secondary | ICD-10-CM | POA: Insufficient documentation

## 2016-02-29 DIAGNOSIS — S92911A Unspecified fracture of right toe(s), initial encounter for closed fracture: Secondary | ICD-10-CM

## 2016-02-29 DIAGNOSIS — Y9361 Activity, american tackle football: Secondary | ICD-10-CM | POA: Insufficient documentation

## 2016-02-29 DIAGNOSIS — Y999 Unspecified external cause status: Secondary | ICD-10-CM | POA: Insufficient documentation

## 2016-02-29 DIAGNOSIS — Y929 Unspecified place or not applicable: Secondary | ICD-10-CM | POA: Insufficient documentation

## 2016-02-29 DIAGNOSIS — W2101XA Struck by football, initial encounter: Secondary | ICD-10-CM | POA: Insufficient documentation

## 2016-02-29 MED ORDER — IBUPROFEN 800 MG PO TABS: 800.0000 mg | ORAL_TABLET | Freq: Three times a day (TID) | ORAL | Status: DC | PRN

## 2016-02-29 MED ORDER — HYDROCODONE-ACETAMINOPHEN 5-325 MG PO TABS: 1.0000 | ORAL_TABLET | ORAL | Status: DC | PRN

## 2016-02-29 NOTE — ED Notes (Signed)
Pt. Reports stubbing toe while playing football on this past Memorial Day weekend. Pt. Reports pain in toe of 8/10 walking, 6/10 at rest. Affected toe warm to touch with swelling present. Denies any s/s to rest of foot. Pt. Active and on feet at work.

## 2016-02-29 NOTE — Discharge Instructions (Signed)
Toe Fracture °A toe fracture is a break in one of the toe bones (phalanges). °CAUSES °This condition may be caused by: °· Dropping a heavy object on your toe. °· Stubbing your toe. °· Overusing your toe or doing repetitive exercise. °· Twisting or stretching your toe out of place. °RISK FACTORS °This condition is more likely to develop in people who: °· Play contact sports. °· Have a bone disease. °· Have a low calcium level. °SYMPTOMS °The main symptoms of this condition are swelling and pain in the toe. The pain may get worse with standing or walking. Other symptoms include: °· Bruising. °· Stiffness. °· Numbness. °· A change in the way the toe looks. °· Broken bones that poke through the skin. °· Blood beneath the toenail. °DIAGNOSIS °This condition is diagnosed with a physical exam. You may also have X-rays. °TREATMENT  °Treatment for this condition depends on the type of fracture and its severity. Treatment may involve: °· Taping the broken toe to a toe that is next to it (buddy taping). This is the most common treatment for fractures in which the bone has not moved out of place (nondisplaced fracture). °· Wearing a shoe that has a wide, rigid sole to protect the toe and to limit its movement. °· Wearing a walking cast. °· Having a procedure to move the toe back into place. °· Surgery. This may be needed: °¨ If there are many pieces of broken bone that are out of place (displaced). °¨ If the toe joint breaks. °¨ If the bone breaks through the skin. °· Physical therapy. This is done to help regain movement and strength in the toe. °You may need follow-up X-rays to make sure that the bone is healing well and staying in position. °HOME CARE INSTRUCTIONS °If You Have a Cast: °· Do not stick anything inside the cast to scratch your skin. Doing that increases your risk of infection. °· Check the skin around the cast every day. Report any concerns to your health care provider. You may put lotion on dry skin around the  edges of the cast. Do not apply lotion to the skin underneath the cast. °· Do not put pressure on any part of the cast until it is fully hardened. This may take several hours. °· Keep the cast clean and dry. °Bathing °· Do not take baths, swim, or use a hot tub until your health care provider approves. Ask your health care provider if you can take showers. You may only be allowed to take sponge baths for bathing. °· If your health care provider approves bathing and showering, cover the cast or bandage (dressing) with a watertight plastic bag to protect it from water. Do not let the cast or dressing get wet. °Managing Pain, Stiffness, and Swelling °· If you do not have a cast, apply ice to the injured area, if directed. °¨ Put ice in a plastic bag. °¨ Place a towel between your skin and the bag. °¨ Leave the ice on for 20 minutes, 2-3 times per day. °· Move your toes often to avoid stiffness and to lessen swelling. °· Raise (elevate) the injured area above the level of your heart while you are sitting or lying down. °Driving °· Do not drive or operate heavy machinery while taking pain medicine. °· Do not drive while wearing a cast on a foot that you use for driving. °Activity °· Return to your normal activities as directed by your health care provider. Ask your health care   provider what activities are safe for you. °· Perform exercises daily as directed by your health care provider or physical therapist. °Safety °· Do not use the injured limb to support your body weight until your health care provider says that you can. Use crutches or other assistive devices as directed by your health care provider. °General Instructions °· If your toe was treated with buddy taping, follow your health care provider's instructions for changing the gauze and tape. Change it more often: °¨ The gauze and tape get wet. If this happens, dry the space between the toes. °¨ The gauze and tape are too tight and cause your toe to become pale  or numb. °· Wear a protective shoe as directed by your health care provider. If you were not given a protective shoe, wear sturdy, supportive shoes. Your shoes should not pinch your toes and should not fit tightly against your toes. °· Do not use any tobacco products, including cigarettes, chewing tobacco, or e-cigarettes. Tobacco can delay bone healing. If you need help quitting, ask your health care provider. °· Take medicines only as directed by your health care provider. °· Keep all follow-up visits as directed by your health care provider. This is important. °SEEK MEDICAL CARE IF: °· You have a fever. °· Your pain medicine is not helping. °· Your toe is cold. °· Your toe is numb. °· You still have pain after one week of rest and treatment. °· You still have pain after your health care provider has said that you can start walking again. °· You have pain, tingling, or numbness in your foot that is not going away. °SEEK IMMEDIATE MEDICAL CARE IF: °· You have severe pain. °· You have redness or inflammation in your toe that is getting worse. °· You have pain or numbness in your toe that is getting worse. °· Your toe turns blue. °  °This information is not intended to replace advice given to you by your health care provider. Make sure you discuss any questions you have with your health care provider. °  °Document Released: 09/09/2000 Document Revised: 06/03/2015 Document Reviewed: 07/09/2014 °Elsevier Interactive Patient Education ©2016 [employer]. ° °

## 2016-02-29 NOTE — ED Notes (Signed)
Pt. Verbalizes understanding of d/c instructions, prescriptions, and follow-up care. VS stable. Pt. Ambulatory out of the unit with steady gait in NAD at time of d/c.    

## 2016-02-29 NOTE — ED Notes (Signed)
Pain 3rd toe R foot x 1 week since injured while playing football.

## 2016-02-29 NOTE — ED Provider Notes (Signed)
[facility] Emergency Department Provider Note  ____________________________________________  Time seen: Approximately 12:40 PM  I have reviewed the triage vital signs and the nursing notes.   HISTORY  Chief Complaint Toe Pain    HPI Enis GashFinese S Farnam is a 20 y.o. male who presents for evaluation of stuffiness football 2 weeks ago. Complains of continued pain of his right third toe. No relief with over-the-counter medications patient reports pain is 7/10.   Past Medical History  Diagnosis Date  . Addison disease (HCC) unk    There are no active problems to display for this patient.   History reviewed. No pertinent past surgical history.  Current Outpatient Rx  Name  Route  Sig  Dispense  Refill  . HYDROcodone-acetaminophen (NORCO) 5-325 MG tablet   Oral   Take 1-2 tablets by mouth every 4 (four) hours as needed for moderate pain.   15 tablet   0   . ibuprofen (ADVIL,MOTRIN) 800 MG tablet   Oral   Take 1 tablet (800 mg total) by mouth every 8 (eight) hours as needed.   30 tablet   0     Allergies Review of patient's allergies indicates no known allergies.  No family history on file.  Social History Social History  Substance Use Topics  . Smoking status: Never Smoker   . Smokeless tobacco: Never Used  . Alcohol Use: No    Review of Systems Constitutional: No fever/chills Musculoskeletal: Positive for right third toe pain. Skin: Negative for rash. Neurological: Negative for headaches, focal weakness or numbness.  10-point ROS otherwise negative.  ____________________________________________   PHYSICAL EXAM:  VITAL SIGNS: ED Triage Vitals  Enc Vitals Group     BP 02/29/16 1126 124/78 mmHg     Pulse Rate 02/29/16 1126 72     Resp 02/29/16 1126 18     Temp 02/29/16 1126 98.1 F (36.7 C)     Temp Source 02/29/16 1126 Oral     SpO2 02/29/16 1126 97 %     Weight 02/29/16 1126 145 lb (65.772 kg)     Height 02/29/16 1126  5\' 8"  (1.727 m)     Head Cir --      Peak Flow --      Pain Score 02/29/16 1127 7     Pain Loc --      Pain Edu? --      Excl. in GC? --     Constitutional: Alert and oriented. Well appearing and in no acute distress. Musculoskeletal: Right third toe positive edema and tenderness at the base. Still he neurovascularly intact. Neurologic:  Normal speech and language. No gross focal neurologic deficits are appreciated. No gait instability. Skin:  Skin is warm, dry and intact. No rash noted. Psychiatric: Mood and affect are normal. Speech and behavior are normal.  ____________________________________________   LABS (all labs ordered are listed, but only abnormal results are displayed)  Labs Reviewed - No data to display ____________________________________________  EKG   ____________________________________________  RADIOLOGY  Oblique fracture of the base of the right third toe. ____________________________________________   PROCEDURES  Procedure(s) performed: None  Critical Care performed: No  ____________________________________________   INITIAL IMPRESSION / ASSESSMENT AND PLAN / ED COURSE  Pertinent labs & imaging results that were available during my care of the patient were reviewed by me and considered in my medical decision making (see chart for details).  Oblique fracture of the third metatarsal. Patient to follow-up with podiatry as needed. Rx given for  ibuprofen and hydrocodone. Continue to wear open toed shoes as much as possible. ____________________________________________   FINAL CLINICAL IMPRESSION(S) / ED DIAGNOSES  Final diagnoses:  Toe fracture, right, closed, initial encounter     This chart was dictated using voice recognition software/Dragon. Despite best efforts to proofread, errors can occur which can change the meaning. Any change was purely unintentional.   Evangeline Dakin, PA-C 02/29/16 1338  Jene Every, MD 02/29/16 [phone]

## 2016-03-22 ENCOUNTER — Emergency Department
Admission: EM | Admit: 2016-03-22 | Discharge: 2016-03-22 | Disposition: A | Discharge status: Home / Self Care | Payer: No Typology Code available for payment source | Attending: Emergency Medicine | Admitting: Emergency Medicine

## 2016-03-22 DIAGNOSIS — Z791 Long term (current) use of non-steroidal anti-inflammatories (NSAID): Secondary | ICD-10-CM | POA: Insufficient documentation

## 2016-03-22 DIAGNOSIS — L42 Pityriasis rosea: Secondary | ICD-10-CM | POA: Insufficient documentation

## 2016-03-22 MED ORDER — TRIAMCINOLONE ACETONIDE 0.1 % EX OINT: 1.0000 "application " | TOPICAL_OINTMENT | Freq: Two times a day (BID) | CUTANEOUS | Status: DC

## 2016-03-22 MED ORDER — RANITIDINE HCL 150 MG PO TABS: 150.0000 mg | ORAL_TABLET | Freq: Two times a day (BID) | ORAL | Status: DC

## 2016-03-22 NOTE — ED Provider Notes (Signed)
[facility] Emergency Department Provider Note ____________________________________________  Time seen: 1907  I have reviewed the triage vital signs and the nursing notes.  HISTORY  Chief Complaint  Rash  HPI Ralph Ross is a 20 y.o. male presents to the ED for evaluation of her rashes been present for the last 2 weeks. He denies any known exposure, contact, or allergies.He reports the rash is itchy and describes it only located to the anterior portion of the chest and torso. Positive diffuse scattered lesions on the upper extremities. He initially thought it was related to an insect bite but has not had any improvement and notes new lesion showing up daily. The lesions tend to dry and scabbed after a few days. She denies any fevers, chills, sweats. Also denies any recent illness. He has been applying topical hydrocortisone cream without significant benefit.  Past Medical History  Diagnosis Date  . Addison disease (HCC) unk    There are no active problems to display for this patient.   History reviewed. No pertinent past surgical history.  Current Outpatient Rx  Name  Route  Sig  Dispense  Refill  . HYDROcodone-acetaminophen (NORCO) 5-325 MG tablet   Oral   Take 1-2 tablets by mouth every 4 (four) hours as needed for moderate pain.   15 tablet   0   . ibuprofen (ADVIL,MOTRIN) 800 MG tablet   Oral   Take 1 tablet (800 mg total) by mouth every 8 (eight) hours as needed.   30 tablet   0   . ranitidine (ZANTAC) 150 MG tablet   Oral   Take 1 tablet (150 mg total) by mouth 2 (two) times daily.   30 tablet   0   . triamcinolone ointment (KENALOG) 0.1 %   Topical   Apply 1 application topically 2 (two) times daily.   30 g   1    Allergies Review of patient's allergies indicates no known allergies.  No family history on file.  Social History Social History  Substance Use Topics  . Smoking status: Never Smoker   . Smokeless tobacco:  Never Used  . Alcohol Use: No    Review of Systems  Constitutional: Negative for fever. Eyes: Negative for visual changes. ENT: Negative for sore throat. Skin: Positive for rash. Neurological: Negative for headaches, focal weakness or numbness. ____________________________________________  PHYSICAL EXAM:  VITAL SIGNS: ED Triage Vitals  Enc Vitals Group     BP 03/22/16 1816 124/79 mmHg     Pulse Rate 03/22/16 1816 65     Resp 03/22/16 1816 16     Temp 03/22/16 1816 98.3 F (36.8 C)     Temp Source 03/22/16 1816 Oral     SpO2 03/22/16 1816 99 %     Weight 03/22/16 1816 145 lb (65.772 kg)     Height 03/22/16 1816 5\' 8"  (1.727 m)     Head Cir --      Peak Flow --      Pain Score 03/22/16 1817 0     Pain Loc --      Pain Edu? --      Excl. in GC? --    Constitutional: Alert and oriented. Well appearing and in no distress. Head: Normocephalic and atraumatic.      Eyes: Conjunctivae are normal. PERRL. Normal extraocular movements      Ears: Canals clear. TMs intact bilaterally.   Nose: No congestion/rhinorrhea.   Mouth/Throat: Mucous membranes are moist.   Neck: Supple.  No thyromegaly. Hematological/Lymphatic/Immunological: No cervical lymphadenopathy. Cardiovascular: Normal rate, regular rhythm.  Respiratory: Normal respiratory effort. No wheezes/rales/rhonchi. Musculoskeletal: Nontender with normal range of motion in all extremities.  Neurologic: No gross focal neurologic deficits are appreciated. Skin:  Skin is warm, dry and intact. Patient with multiple scattered macular papular lesions noted of the anterior chest and torso. The lesions appear to be slightly ovoid in shape and tend to fall along the natural skin lines across the chest and torso. Some of the areas appear slightly excoriated and scabbed. There is no weeping, fluctuates, pointing, no induration noted. ____________________________________________  INITIAL IMPRESSION / ASSESSMENT AND PLAN / ED  COURSE  Patient appears to be a clinical presentation consistent with pityriasis rosea. He and his mom are advised on the self-limited nature of the rash. He is advised that treatment of itching is the primary goal of management. He is discharged with a prescription for triamcinolone ointment and ranitidine. He will dose over-the-counter Benadryl as needed for additional histamine blockade. Follow with his primary care provider for ongoing symptom management. ____________________________________________  FINAL CLINICAL IMPRESSION(S) / ED DIAGNOSES  Final diagnoses:  Pityriasis rosea     Lissa HoardJenise V Bacon Shamaine Mulkern, PA-C 03/22/16 2037  Governor Rooksebecca Lord, MD 03/22/16 2100

## 2016-03-22 NOTE — ED Notes (Signed)
Pt c/o itchy rash for the past 2 weeks that seems to get better and then returns.

## 2016-03-22 NOTE — Discharge Instructions (Signed)
Pityriasis Rosea Pityriasis rosea is a rash that usually appears on the trunk of the body. It may also appear on the upper arms and upper legs. It usually begins as a single patch, and then more patches begin to develop. The rash may cause mild itching, but it normally does not cause other problems. It usually goes away without treatment. However, it may take weeks or months for the rash to go away completely. CAUSES The cause of this condition is not known. The condition does not spread from person to person (is noncontagious). RISK FACTORS This condition is more likely to develop in young adults and children. It is most common in the spring and fall. SYMPTOMS The main symptom of this condition is a rash.  The rash usually begins with a single oval patch that is larger than the ones that follow. This is called a herald patch. It generally appears a week or more before the rest of the rash appears.  When more patches start to develop, they spread quickly on the trunk, back, and arms. These patches are smaller than the first one.  The patches that make up the rash are usually oval-shaped and pink or red in color. They are usually flat, but they may sometimes be raised so that they can be felt with a finger. They may also be finely crinkled and have a scaly ring around the edge.  The rash does not typically appear on areas of the skin that are exposed to the sun. Most people who have this condition do not have other symptoms, but some have mild itching. In a few cases, a mild headache or body aches may occur before the rash appears and then go away. DIAGNOSIS Your health care provider may diagnose this condition by doing a physical exam and taking your medical history. To rule out other possible causes for the rash, the health care provider may order blood tests or take a skin sample from the rash to be looked at under a microscope. TREATMENT Usually, treatment is not needed for this condition. The  rash will probably go away on its own in 4-8 weeks. In some cases, a health care provider may recommend or prescribe medicine to reduce itching. HOME CARE INSTRUCTIONS  Take medicines only as directed by your health care provider.  Avoid scratching the affected areas of skin.  Do not take hot baths or use a sauna. Use only warm water when bathing or showering. Heat can increase itching. SEEK MEDICAL CARE IF:  Your rash does not go away in 8 weeks.  Your rash gets much worse.  You have a fever.  You have swelling or pain in the rash area.  You have fluid, blood, or pus coming from the rash area.   This information is not intended to replace advice given to you by your health care provider. Make sure you discuss any questions you have with your health care provider.   Document Released: 10/19/2001 Document Revised: 01/27/2015 Document Reviewed: 08/20/2014 Elsevier Interactive Patient Education [employer]2016 Elsevier Inc.   The rash should clear up on its own. Take the Zantac (ranitidine) as directed along with Benadryl (diphenhydramine) for itch relief. Also use the steroid cream as needed. Follow-up with your provider for any further problems.

## 2017-04-10 ENCOUNTER — Emergency Department
Admission: EM | Admit: 2017-04-10 | Discharge: 2017-04-10 | Disposition: A | Discharge status: Home / Self Care | Payer: Worker's Compensation | Attending: Emergency Medicine | Admitting: Emergency Medicine

## 2017-04-10 ENCOUNTER — Encounter: Payer: Self-pay | Admitting: Emergency Medicine

## 2017-04-10 DIAGNOSIS — Y9389 Activity, other specified: Secondary | ICD-10-CM | POA: Diagnosis not present

## 2017-04-10 DIAGNOSIS — Y990 Civilian activity done for income or pay: Secondary | ICD-10-CM | POA: Insufficient documentation

## 2017-04-10 DIAGNOSIS — S51811A Laceration without foreign body of right forearm, initial encounter: Secondary | ICD-10-CM | POA: Diagnosis not present

## 2017-04-10 DIAGNOSIS — Y9229 Other specified public building as the place of occurrence of the external cause: Secondary | ICD-10-CM | POA: Diagnosis not present

## 2017-04-10 DIAGNOSIS — W260XXA Contact with knife, initial encounter: Secondary | ICD-10-CM | POA: Insufficient documentation

## 2017-04-10 MED ORDER — IBUPROFEN 600 MG PO TABS
600.0000 mg | ORAL_TABLET | Freq: Once | ORAL | Status: AC
  Administered 2017-04-10: 600 mg via ORAL
  Filled 2017-04-10: qty 1

## 2017-04-10 MED ORDER — TRAMADOL HCL 50 MG PO TABS: 50.0000 mg | ORAL_TABLET | Freq: Four times a day (QID) | ORAL | 0 refills | Status: DC | PRN

## 2017-04-10 MED ORDER — LIDOCAINE HCL (PF) 1 % IJ SOLN
INTRAMUSCULAR | Status: DC
Start: 2017-04-10 — End: 2017-04-10
  Filled 2017-04-10: qty 5

## 2017-04-10 MED ORDER — TRAMADOL HCL 50 MG PO TABS
50.0000 mg | ORAL_TABLET | Freq: Once | ORAL | Status: AC
  Administered 2017-04-10: 50 mg via ORAL
  Filled 2017-04-10: qty 1

## 2017-04-10 NOTE — ED Triage Notes (Signed)
Pt arrived via EMS from work at [employer]. Pt reports was opening a box with a knife and accidentally cut right forearm. Pt arrived with dressing intact, no active bleeding. Pt with noted movement and sensation to right arm and fingers.

## 2017-04-10 NOTE — ED Provider Notes (Signed)
[facility] Emergency Department Provider Note   ____________________________________________   None    (approximate)  I have reviewed the triage vital signs and the nursing notes.   HISTORY  Chief Complaint Extremity Laceration    HPI Ralph Ross is a 21 y.o. male patient's percent percent with a laceration to the right forearm. Patient arrived via EMS secondary to a box Cutter laceration to the right forearm. Patient state bleeding is controlled direct pressure. Patient denies loss sensation or loss of function of the affected extremity. Patient is left-hand dominant. Patient rates the pain as a 7/10. Patient described a pain as "achy". Patient tetanus shot is up-to-date.   Past Medical History:  Diagnosis Date  . Addison disease (HCC) unk    There are no active problems to display for this patient.   No past surgical history on file.  Prior to Admission medications   Medication Sig Start Date End Date Taking? Authorizing Provider  HYDROcodone-acetaminophen (NORCO) 5-325 MG tablet Take 1-2 tablets by mouth every 4 (four) hours as needed for moderate pain. 02/29/16   Beers, Charmayne Sheer, PA-C  ibuprofen (ADVIL,MOTRIN) 800 MG tablet Take 1 tablet (800 mg total) by mouth every 8 (eight) hours as needed. 02/29/16   Beers, Charmayne Sheer, PA-C  ranitidine (ZANTAC) 150 MG tablet Take 1 tablet (150 mg total) by mouth 2 (two) times daily. 03/22/16   Menshew, Charlesetta Ivory, PA-C  triamcinolone ointment (KENALOG) 0.1 % Apply 1 application topically 2 (two) times daily. 03/22/16   Menshew, Charlesetta Ivory, PA-C    Allergies Patient has no known allergies.  No family history on file.  Social History Social History  Substance Use Topics  . Smoking status: Never Smoker  . Smokeless tobacco: Never Used  . Alcohol use No    Review of Systems  Constitutional: No fever/chills Eyes: No visual changes. ENT: No sore throat. Cardiovascular: Denies chest  pain. Respiratory: Denies shortness of breath. Gastrointestinal: No abdominal pain.  No nausea, no vomiting.  No diarrhea.  No constipation. Genitourinary: Negative for dysuria. Musculoskeletal: Negative for back pain. Skin: Negative for rash. Right forearm laceration Neurological: Negative for headaches, focal weakness or numbness.   ____________________________________________   PHYSICAL EXAM:  VITAL SIGNS: ED Triage Vitals [04/10/17 0932]  Enc Vitals Group     BP 107/87     Pulse Rate 62     Resp 18     Temp 98 F (36.7 C)     Temp Source Oral     SpO2 100 %     Weight 160 lb (72.6 kg)     Height 5\' 8"  (1.727 m)     Head Circumference      Peak Flow      Pain Score 7     Pain Loc      Pain Edu?      Excl. in GC?     Constitutional: Alert and oriented. Well appearing and in no acute distress. Musculoskeletal: No lower extremity tenderness nor edema.  No joint effusions. Neurologic:  Normal speech and language. No gross focal neurologic deficits are appreciated. No gait instability. Skin:  Skin is warm, dry and intact. No rash noted. 1.5 cm laceration palmar aspect of right forearm. Psychiatric: Mood and affect are normal. Speech and behavior are normal.  ____________________________________________   LABS (all labs ordered are listed, but only abnormal results are displayed)  Labs Reviewed - No data to display ____________________________________________  EKG   ____________________________________________  RADIOLOGY  No results found.  ____________________________________________   PROCEDURES  Procedure(s) performed: LACERATION REPAIR Performed by: Joni Reiningonald K Smith Authorized by: Joni Reiningonald K Smith Consent: Verbal consent obtained. Risks and benefits: risks, benefits and alternatives were discussed Consent given by: patient Patient identity confirmed: provided demographic data Prepped and Draped in normal sterile fashion Wound explored  Laceration  Location:Right forearm  Laceration Length: 1.5cm  No Foreign Bodies seen or palpated  Anesthesia: local infiltration  Local anesthetic: lidocaine 1% without epinephrine  Anesthetic total: 2  ml  Irrigation method: syringe Amount of cleaning: standard  Skin closure: 3-0 nylon Number of sutures: 5 Technique: Interrupted Patient tolerance: Patient tolerated the procedure well with no immediate complications.   Procedures  Critical Care performed: No  ____________________________________________   INITIAL IMPRESSION / ASSESSMENT AND PLAN / ED COURSE  Pertinent labs & imaging results that were available during my care of the patient were reviewed by me and considered in my medical decision making (see chart for details).  Right forearm laceration. Patient given discharge care instructions. Patient [10 days for suture removal.      ____________________________________________   FINAL CLINICAL IMPRESSION(S) / ED DIAGNOSES  Final diagnoses:  None      NEW MEDICATIONS STARTED DURING THIS VISIT:  New Prescriptions   No medications on file     Note:  This document was prepared using Dragon voice recognition software and may include unintentional dictation errors.    Joni ReiningSmith, Ronald K, PA-C 04/10/17 1057    Don PerkingVeronese, [place], MD 04/11/17 [phone]

## 2017-04-10 NOTE — ED Notes (Signed)
Workers comp complete and urine walked down to lab.

## 2017-04-10 NOTE — ED Notes (Signed)
Spoke with Morrie SheldonAshley from [employer] concerning w/c injury  [facility] was to [facility] but was brought in via ems   Areatha Keasshley okayed the treatment and UDS to be done here

## 2017-04-24 ENCOUNTER — Emergency Department
Admission: EM | Admit: 2017-04-24 | Discharge: 2017-04-24 | Disposition: A | Discharge status: Home / Self Care | Payer: Worker's Compensation | Attending: Emergency Medicine | Admitting: Emergency Medicine

## 2017-04-24 ENCOUNTER — Encounter: Payer: Self-pay | Admitting: Emergency Medicine

## 2017-04-24 DIAGNOSIS — Z79899 Other long term (current) drug therapy: Secondary | ICD-10-CM | POA: Diagnosis not present

## 2017-04-24 DIAGNOSIS — S51811D Laceration without foreign body of right forearm, subsequent encounter: Secondary | ICD-10-CM | POA: Insufficient documentation

## 2017-04-24 DIAGNOSIS — Z4802 Encounter for removal of sutures: Secondary | ICD-10-CM | POA: Diagnosis not present

## 2017-04-24 DIAGNOSIS — X58XXXD Exposure to other specified factors, subsequent encounter: Secondary | ICD-10-CM | POA: Insufficient documentation

## 2017-04-24 NOTE — ED Triage Notes (Signed)
Here for suture removal

## 2017-04-24 NOTE — ED Provider Notes (Signed)
[facility] Emergency Department Provider Note  ____________________________________________  Time seen: Approximately 4:24 PM  I have reviewed the triage vital signs and the nursing notes.   HISTORY  Chief Complaint Suture / Staple Removal    HPI Ralph Ross is a 21 y.o. male who presents emergency department for suture removal. Patient was seen in this department 14 days prior with laceration to the right forearm. This was closed with 5 sutures. The patient denies any erythema, edema, pain, drainage from site. He is here for suture removal. No other complaints at this time.   Past Medical History:  Diagnosis Date  . Addison disease (HCC) unk    There are no active problems to display for this patient.   History reviewed. No pertinent surgical history.  Prior to Admission medications   Medication Sig Start Date End Date Taking? Authorizing Provider  HYDROcodone-acetaminophen (NORCO) 5-325 MG tablet Take 1-2 tablets by mouth every 4 (four) hours as needed for moderate pain. 02/29/16   Beers, Charmayne Sheerharles M, PA-C  ibuprofen (ADVIL,MOTRIN) 800 MG tablet Take 1 tablet (800 mg total) by mouth every 8 (eight) hours as needed. 02/29/16   Beers, Charmayne Sheerharles M, PA-C  ranitidine (ZANTAC) 150 MG tablet Take 1 tablet (150 mg total) by mouth 2 (two) times daily. 03/22/16   Menshew, Charlesetta IvoryJenise V Bacon, PA-C  traMADol (ULTRAM) 50 MG tablet Take 1 tablet (50 mg total) by mouth every 6 (six) hours as needed for moderate pain. 04/10/17   Joni ReiningSmith, Ronald K, PA-C  triamcinolone ointment (KENALOG) 0.1 % Apply 1 application topically 2 (two) times daily. 03/22/16   Menshew, Charlesetta IvoryJenise V Bacon, PA-C    Allergies Patient has no known allergies.  No family history on file.  Social History Social History  Substance Use Topics  . Smoking status: Never Smoker  . Smokeless tobacco: Never Used  . Alcohol use No     Review of Systems  Constitutional: No fever/chills Cardiovascular: no  chest pain. Respiratory: no cough. No SOB. Musculoskeletal: Negative for musculoskeletal pain. Skin: Positive for sutured laceration to the right forearm Neurological: Negative for headaches, focal weakness or numbness. 10-point ROS otherwise negative.  ____________________________________________   PHYSICAL EXAM:  VITAL SIGNS: ED Triage Vitals  Enc Vitals Group     BP      Pulse      Resp      Temp      Temp src      SpO2      Weight      Height      Head Circumference      Peak Flow      Pain Score      Pain Loc      Pain Edu?      Excl. in GC?      Constitutional: Alert and oriented. Well appearing and in no acute distress. Eyes: Conjunctivae are normal. PERRL. EOMI. Head: Atraumatic. Neck: No stridor.    Cardiovascular: Normal rate, regular rhythm. Normal S1 and S2.  Good peripheral circulation. Respiratory: Normal respiratory effort without tachypnea or retractions. Lungs CTAB. Good air entry to the bases with no decreased or absent breath sounds. Musculoskeletal: Full range of motion to all extremities. No gross deformities appreciated. Neurologic:  Normal speech and language. No gross focal neurologic deficits are appreciated.  Skin:  Skin is warm, dry and intact. No rash noted.Sutured laceration noted to the right forearm. Linear nature. Edges are well approximated. 5 sutures are placed. No signs of  infection. Psychiatric: Mood and affect are normal. Speech and behavior are normal. Patient exhibits appropriate insight and judgement.   ____________________________________________   LABS (all labs ordered are listed, but only abnormal results are displayed)  Labs Reviewed - No data to display ____________________________________________  EKG   ____________________________________________  RADIOLOGY   No results found.  ____________________________________________    PROCEDURES  Procedure(s) performed:    .Suture Removal Date/Time:  04/24/2017 4:38 PM Performed by: Gala RomneyUTHRIELL, JONATHAN D Authorized by: Gala RomneyUTHRIELL, JONATHAN D   Consent:    Consent obtained:  Verbal   Consent given by:  Patient   Risks discussed:  Pain and wound separation Location:    Location:  Upper extremity   Upper extremity location:  Arm   Arm location:  R lower arm Procedure details:    Wound appearance:  No signs of infection and good wound healing   Number of sutures removed:  5 Post-procedure details:    Post-removal:  Steri-Strips applied   Patient tolerance of procedure:  Tolerated well, no immediate complications Comments:     5 sutures are removed. Laceration was deep in nature, wound is healing appropriately with no subcutaneous tissue visualized. Wound edge mildly dehisced with removal of the suture. Again, underlying skin is healing appropriately with no exposed subcutaneous tissue. Steri-Strips are applied for further stabilization for the next 4-5 days.      Medications - No data to display   ____________________________________________   INITIAL IMPRESSION / ASSESSMENT AND PLAN / ED COURSE  Pertinent labs & imaging results that were available during my care of the patient were reviewed by me and considered in my medical decision making (see chart for details).  Review of the [place] CSRS was performed in accordance of the [facility] prior to dispensing any controlled drugs.     Patient's diagnosis is consistent with suture removal. Suture removal as described above. Steri-Strips are placed for further stabilization for the next 4-5 days. No signs of infection. Patient is given ED precautions to return to the ED for any worsening or new symptoms.     ____________________________________________  FINAL CLINICAL IMPRESSION(S) / ED DIAGNOSES  Final diagnoses:  Visit for suture removal      NEW MEDICATIONS STARTED DURING THIS VISIT:  New Prescriptions   No medications on file        This chart was dictated using  voice recognition software/Dragon. Despite best efforts to proofread, errors can occur which can change the meaning. Any change was purely unintentional.    Racheal PatchesCuthriell, Jonathan D, PA-C 04/24/17 1639    Governor RooksLord, Rebecca, MD 04/24/17 2005

## 2017-06-29 ENCOUNTER — Encounter (HOSPITAL_COMMUNITY): Payer: Self-pay | Admitting: Emergency Medicine

## 2017-06-29 ENCOUNTER — Emergency Department (HOSPITAL_COMMUNITY)
Admission: EM | Admit: 2017-06-29 | Discharge: 2017-06-29 | Disposition: A | Discharge status: Home / Self Care | Payer: Self-pay | Attending: Emergency Medicine | Admitting: Emergency Medicine

## 2017-06-29 DIAGNOSIS — R21 Rash and other nonspecific skin eruption: Secondary | ICD-10-CM | POA: Insufficient documentation

## 2017-06-29 DIAGNOSIS — Z5321 Procedure and treatment not carried out due to patient leaving prior to being seen by health care provider: Secondary | ICD-10-CM | POA: Insufficient documentation

## 2017-06-29 NOTE — ED Notes (Signed)
This RN witnessed patient leaving department with visitor, has not returned for 30 minutes.

## 2017-06-29 NOTE — ED Triage Notes (Signed)
Pt. reports itchy skin rashes around his right eye this evening after eating red grapes , denies SOB /airway intact with no oral swelling , no blurred vision .

## 2018-08-30 ENCOUNTER — Observation Stay (HOSPITAL_BASED_OUTPATIENT_CLINIC_OR_DEPARTMENT_OTHER)
Admit: 2018-08-30 | Discharge: 2018-08-30 | Disposition: A | Discharge status: Home / Self Care | Payer: Self-pay | Attending: Internal Medicine | Admitting: Internal Medicine

## 2018-08-30 ENCOUNTER — Observation Stay
Admission: EM | Admit: 2018-08-30 | Discharge: 2018-08-31 | Disposition: A | Discharge status: Home / Self Care | Payer: Self-pay | Attending: Internal Medicine | Admitting: Internal Medicine

## 2018-08-30 ENCOUNTER — Other Ambulatory Visit: Payer: Self-pay

## 2018-08-30 DIAGNOSIS — Z791 Long term (current) use of non-steroidal anti-inflammatories (NSAID): Secondary | ICD-10-CM | POA: Insufficient documentation

## 2018-08-30 DIAGNOSIS — E860 Dehydration: Secondary | ICD-10-CM | POA: Insufficient documentation

## 2018-08-30 DIAGNOSIS — Z79899 Other long term (current) drug therapy: Secondary | ICD-10-CM | POA: Insufficient documentation

## 2018-08-30 DIAGNOSIS — R55 Syncope and collapse: Principal | ICD-10-CM | POA: Diagnosis present

## 2018-08-30 DIAGNOSIS — E272 Addisonian crisis: Secondary | ICD-10-CM | POA: Insufficient documentation

## 2018-08-30 DIAGNOSIS — E876 Hypokalemia: Secondary | ICD-10-CM | POA: Insufficient documentation

## 2018-08-30 DIAGNOSIS — R410 Disorientation, unspecified: Secondary | ICD-10-CM | POA: Insufficient documentation

## 2018-08-30 LAB — URINALYSIS, COMPLETE (UACMP) WITH MICROSCOPIC
BACTERIA UA: NONE SEEN
BILIRUBIN URINE: NEGATIVE
Glucose, UA: NEGATIVE mg/dL
Hgb urine dipstick: NEGATIVE
KETONES UR: NEGATIVE mg/dL
Leukocytes, UA: NEGATIVE
NITRITE: NEGATIVE
Protein, ur: NEGATIVE mg/dL
Specific Gravity, Urine: 1.003 — ABNORMAL LOW (ref 1.005–1.030)
Squamous Epithelial / LPF: NONE SEEN (ref 0–5)
WBC UA: NONE SEEN WBC/hpf (ref 0–5)
pH: 8 (ref 5.0–8.0)

## 2018-08-30 LAB — CBC WITH DIFFERENTIAL/PLATELET
ABS IMMATURE GRANULOCYTES: 0.04 10*3/uL (ref 0.00–0.07)
BASOS ABS: 0 10*3/uL (ref 0.0–0.1)
Basophils Relative: 0 %
Eosinophils Absolute: 0.2 10*3/uL (ref 0.0–0.5)
Eosinophils Relative: 2 %
HEMATOCRIT: 39.1 % (ref 39.0–52.0)
HEMOGLOBIN: 13.3 g/dL (ref 13.0–17.0)
Immature Granulocytes: 0 %
LYMPHS ABS: 3.7 10*3/uL (ref 0.7–4.0)
LYMPHS PCT: 39 %
MCH: 30.4 pg (ref 26.0–34.0)
MCHC: 34 g/dL (ref 30.0–36.0)
MCV: 89.3 fL (ref 80.0–100.0)
MONO ABS: 0.6 10*3/uL (ref 0.1–1.0)
Monocytes Relative: 7 %
NEUTROS ABS: 4.8 10*3/uL (ref 1.7–7.7)
NRBC: 0 % (ref 0.0–0.2)
Neutrophils Relative %: 52 %
Platelets: 218 10*3/uL (ref 150–400)
RBC: 4.38 MIL/uL (ref 4.22–5.81)
RDW: 12.3 % (ref 11.5–15.5)
WBC: 9.3 10*3/uL (ref 4.0–10.5)

## 2018-08-30 LAB — SALICYLATE LEVEL

## 2018-08-30 LAB — ETHANOL

## 2018-08-30 LAB — TROPONIN I
Troponin I: <0.03 ng/mL (ref <0.03)
Troponin I: <0.03 ng/mL (ref <0.03)

## 2018-08-30 LAB — COMPREHENSIVE METABOLIC PANEL
ALT: 17 U/L (ref 0–44)
AST: 27 U/L (ref 15–41)
Albumin: 4.4 g/dL (ref 3.5–5.0)
Alkaline Phosphatase: 89 U/L (ref 38–126)
Anion gap: 11 (ref 5–15)
BUN: 10 mg/dL (ref 6–20)
CALCIUM: 9.3 mg/dL (ref 8.9–10.3)
CHLORIDE: 108 mmol/L (ref 98–111)
CO2: 25 mmol/L (ref 22–32)
Creatinine, Ser: 0.69 mg/dL (ref 0.61–1.24)
Glucose, Bld: 98 mg/dL (ref 70–99)
Potassium: 2.7 mmol/L — CL (ref 3.5–5.1)
SODIUM: 144 mmol/L (ref 135–145)
Total Bilirubin: 0.4 mg/dL (ref 0.3–1.2)
Total Protein: 6.9 g/dL (ref 6.5–8.1)

## 2018-08-30 LAB — GLUCOSE, CAPILLARY
Glucose-Capillary: 75 mg/dL (ref 70–99)
Glucose-Capillary: 100 mg/dL — ABNORMAL HIGH (ref 70–99)
Glucose-Capillary: 103 mg/dL — ABNORMAL HIGH (ref 70–99)
Glucose-Capillary: 109 mg/dL — ABNORMAL HIGH (ref 70–99)

## 2018-08-30 LAB — CG4 I-STAT (LACTIC ACID): Lactic Acid, Venous: 1.45 mmol/L (ref 0.5–1.9)

## 2018-08-30 LAB — MAGNESIUM: Magnesium: 2.1 mg/dL (ref 1.7–2.4)

## 2018-08-30 LAB — PHOSPHORUS: Phosphorus: 3.2 mg/dL (ref 2.5–4.6)

## 2018-08-30 LAB — URINE DRUG SCREEN, QUALITATIVE (ARMC ONLY)
Amphetamines, Ur Screen: NOT DETECTED
BARBITURATES, UR SCREEN: NOT DETECTED
Benzodiazepine, Ur Scrn: NOT DETECTED
CANNABINOID 50 NG, UR ~~LOC~~: POSITIVE — AB
Cocaine Metabolite,Ur ~~LOC~~: NOT DETECTED
MDMA (Ecstasy)Ur Screen: NOT DETECTED
Methadone Scn, Ur: NOT DETECTED
OPIATE, UR SCREEN: NOT DETECTED
PHENCYCLIDINE (PCP) UR S: NOT DETECTED
Tricyclic, Ur Screen: NOT DETECTED

## 2018-08-30 LAB — LIPASE, BLOOD: LIPASE: 31 U/L (ref 11–51)

## 2018-08-30 LAB — ECHOCARDIOGRAM COMPLETE
Height: 68 in
Weight: 2467.2 oz

## 2018-08-30 LAB — POTASSIUM: Potassium: 3.7 mmol/L (ref 3.5–5.1)

## 2018-08-30 LAB — ACETAMINOPHEN LEVEL

## 2018-08-30 MED ORDER — SENNOSIDES-DOCUSATE SODIUM 8.6-50 MG PO TABS: 1.0000 | ORAL_TABLET | Freq: Every evening | ORAL | Status: DC | PRN

## 2018-08-30 MED ORDER — SODIUM CHLORIDE 0.9 % IV SOLN
Freq: Once | INTRAVENOUS | Status: AC
  Administered 2018-08-30: 07:00:00 via INTRAVENOUS

## 2018-08-30 MED ORDER — ALUM & MAG HYDROXIDE-SIMETH 200-200-20 MG/5ML PO SUSP: 15.0000 mL | ORAL | Status: DC | PRN

## 2018-08-30 MED ORDER — HYDROCORTISONE 10 MG PO TABS
10.0000 mg | ORAL_TABLET | Freq: Every evening | ORAL | Status: DC
  Administered 2018-08-30: 10 mg via ORAL
  Filled 2018-08-30 (×2): qty 1

## 2018-08-30 MED ORDER — POTASSIUM CHLORIDE CRYS ER 20 MEQ PO TBCR: 40.0000 meq | EXTENDED_RELEASE_TABLET | Freq: Two times a day (BID) | ORAL | Status: DC

## 2018-08-30 MED ORDER — ONDANSETRON HCL 4 MG/2ML IJ SOLN: 4.0000 mg | Freq: Four times a day (QID) | INTRAMUSCULAR | Status: DC | PRN

## 2018-08-30 MED ORDER — ACETAMINOPHEN 650 MG RE SUPP: 650.0000 mg | Freq: Four times a day (QID) | RECTAL | Status: DC | PRN

## 2018-08-30 MED ORDER — ENOXAPARIN SODIUM 40 MG/0.4ML ~~LOC~~ SOLN
40.0000 mg | SUBCUTANEOUS | Status: DC
  Administered 2018-08-30: 40 mg via SUBCUTANEOUS
  Filled 2018-08-30: qty 0.4

## 2018-08-30 MED ORDER — POTASSIUM CHLORIDE CRYS ER 20 MEQ PO TBCR
40.0000 meq | EXTENDED_RELEASE_TABLET | Freq: Once | ORAL | Status: AC
  Administered 2018-08-30: 40 meq via ORAL
  Filled 2018-08-30: qty 2

## 2018-08-30 MED ORDER — SODIUM CHLORIDE 0.9 % IV BOLUS
1000.0000 mL | Freq: Once | INTRAVENOUS | Status: AC
  Administered 2018-08-30: 1000 mL via INTRAVENOUS

## 2018-08-30 MED ORDER — FLUDROCORTISONE ACETATE 0.1 MG PO TABS: 0.1000 mg | ORAL_TABLET | Freq: Every day | ORAL | Status: DC

## 2018-08-30 MED ORDER — POTASSIUM CHLORIDE 10 MEQ/100ML IV SOLN
10.0000 meq | INTRAVENOUS | Status: AC
  Administered 2018-08-30 (×4): 10 meq via INTRAVENOUS
  Filled 2018-08-30 (×6): qty 100

## 2018-08-30 MED ORDER — HYDROCORTISONE NA SUCCINATE PF 100 MG IJ SOLR
100.0000 mg | INTRAMUSCULAR | Status: AC
  Administered 2018-08-30: 100 mg via INTRAVENOUS
  Filled 2018-08-30: qty 2

## 2018-08-30 MED ORDER — HYDROCORTISONE 10 MG PO TABS
20.0000 mg | ORAL_TABLET | Freq: Every day | ORAL | Status: DC
  Administered 2018-08-30 – 2018-08-31 (×2): 20 mg via ORAL
  Filled 2018-08-30 (×2): qty 2

## 2018-08-30 MED ORDER — FLUDROCORTISONE ACETATE 0.1 MG PO TABS
0.2000 mg | ORAL_TABLET | Freq: Every day | ORAL | Status: DC
  Administered 2018-08-30 – 2018-08-31 (×2): 0.2 mg via ORAL
  Filled 2018-08-30 (×2): qty 2

## 2018-08-30 MED ORDER — BISACODYL 5 MG PO TBEC: 5.0000 mg | DELAYED_RELEASE_TABLET | Freq: Every day | ORAL | Status: DC | PRN

## 2018-08-30 MED ORDER — ONDANSETRON HCL 4 MG PO TABS: 4.0000 mg | ORAL_TABLET | Freq: Four times a day (QID) | ORAL | Status: DC | PRN

## 2018-08-30 MED ORDER — SODIUM CHLORIDE 0.9% FLUSH
3.0000 mL | Freq: Two times a day (BID) | INTRAVENOUS | Status: DC
  Administered 2018-08-30 – 2018-08-31 (×3): 3 mL via INTRAVENOUS

## 2018-08-30 MED ORDER — ACETAMINOPHEN 325 MG PO TABS: 650.0000 mg | ORAL_TABLET | Freq: Four times a day (QID) | ORAL | Status: DC | PRN

## 2018-08-30 MED ORDER — LACTATED RINGERS IV SOLN: INTRAVENOUS | Status: AC

## 2018-08-30 MED ORDER — POTASSIUM CHLORIDE CRYS ER 20 MEQ PO TBCR: 40.0000 meq | EXTENDED_RELEASE_TABLET | Freq: Once | ORAL | Status: DC

## 2018-08-30 NOTE — ED Notes (Signed)
MD and mother at bedside.

## 2018-08-30 NOTE — Progress Notes (Signed)
[facility] - [place] at [facility]   PATIENT NAME: Ralph Ross    MR#:  [id number]  DATE OF BIRTH:  1996/06/11  SUBJECTIVE:  CHIEF COMPLAINT:   Chief Complaint  Patient presents with  . Loss of Consciousness  Seen and evaluated today No new episodes of dizziness No nausea or vomiting  REVIEW OF SYSTEMS:    ROS  CONSTITUTIONAL: No documented fever. No fatigue, weakness. No weight gain, no weight loss.  EYES: No blurry or double vision.  ENT: No tinnitus. No postnasal drip. No redness of the oropharynx.  RESPIRATORY: No cough, no wheeze, no hemoptysis. No dyspnea.  CARDIOVASCULAR: No chest pain. No orthopnea. No palpitations. No syncope.  GASTROINTESTINAL: No nausea, no vomiting or diarrhea. No abdominal pain. No melena or hematochezia.  GENITOURINARY: No dysuria or hematuria.  ENDOCRINE: No polyuria or nocturia. No heat or cold intolerance.  HEMATOLOGY: No anemia. No bruising. No bleeding.  INTEGUMENTARY: No rashes. No lesions.  MUSCULOSKELETAL: No arthritis. No swelling. No gout.  NEUROLOGIC: No numbness, tingling, or ataxia. No seizure-type activity.  PSYCHIATRIC: No anxiety. No insomnia. No ADD.   DRUG ALLERGIES:  No Known Allergies  VITALS:  Blood pressure 110/74, pulse 69, temperature 98.6 F (37 C), temperature source Oral, resp. rate 18, height 5\' 8"  (1.727 m), weight 69.9 kg, SpO2 100 %.  PHYSICAL EXAMINATION:   Physical Exam  GENERAL:  22 y.o.-year-old patient lying in the bed with no acute distress.  EYES: Pupils equal, round, reactive to light and accommodation. No scleral icterus. Extraocular muscles intact.  HEENT: Head atraumatic, normocephalic. Oropharynx and nasopharynx clear.  NECK:  Supple, no jugular venous distention. No thyroid enlargement, no tenderness.  LUNGS: Normal breath sounds bilaterally, no wheezing, rales, rhonchi. No use of accessory muscles of respiration.  CARDIOVASCULAR: S1, S2 normal. No murmurs, rubs, or  gallops.  ABDOMEN: Soft, nontender, nondistended. Bowel sounds present. No organomegaly or mass.  EXTREMITIES: No cyanosis, clubbing or edema b/l.    NEUROLOGIC: Cranial nerves II through XII are intact. No focal Motor or sensory deficits b/l.   PSYCHIATRIC: The patient is alert and oriented x 3.  SKIN: No obvious rash, lesion, or ulcer.   LABORATORY PANEL:   CBC Recent Labs  Lab 08/30/18 0411  WBC 9.3  HGB 13.3  HCT 39.1  PLT 218   ------------------------------------------------------------------------------------------------------------------ Chemistries  Recent Labs  Lab 08/30/18 0411  NA 144  K 2.7*  CL 108  CO2 25  GLUCOSE 98  BUN 10  CREATININE 0.69  CALCIUM 9.3  MG 2.1  AST 27  ALT 17  ALKPHOS 89  BILITOT 0.4   ------------------------------------------------------------------------------------------------------------------  Cardiac Enzymes Recent Labs  Lab 08/30/18 0822  TROPONINI <0.03   ------------------------------------------------------------------------------------------------------------------  RADIOLOGY:  No results found.   ASSESSMENT AND PLAN:  22 year old male patient with history of adrenal insufficiency, Addison's disease presented to the emergency room for passing out  -Acute hypokalemia Place potassium aggressively  -Dehydration IV fluids  -Syncope probably secondary to adrenal insufficiency and dehydration Fludrocortisone deficiency Increase the dose Endocrinology follow-up at [facility] as outpatient  -DVT prophylaxis subcu Lovenox daily  All the records are reviewed and case discussed with Care Management/Social Worker. Management plans discussed with the patient, family and they are in agreement.  CODE STATUS: Full code  DVT Prophylaxis: SCDs  TOTAL TIME TAKING CARE OF THIS PATIENT: 45 minutes.   POSSIBLE D/C IN 1 DAYS, DEPENDING ON CLINICAL CONDITION.  Ihor Austin M.D on 08/30/2018 at 11:55 AM  Between  7am to 6pm - Pager - [phone]  After 6pm go to [email or web address] - password [email or web address] [facility]  [facility]  Office  [phone]  CC: Primary care physician; [facility]  Note: This dictation was prepared with Nurse, children'sDragon dictation along with smaller phrase technology. Any transcriptional errors that result from this process are unintentional.

## 2018-08-30 NOTE — Progress Notes (Signed)
*  PRELIMINARY RESULTS* Echocardiogram 2D Echocardiogram has been performed.  Ralph GulaJoan M Lorrain Ross 08/30/2018, 11:52 AM

## 2018-08-30 NOTE — ED Notes (Signed)
Pt okay with IV insertion and medications. Pt appears somewhat anxious and restless in bed.

## 2018-08-30 NOTE — ED Notes (Signed)
Admitting MD at bedside.

## 2018-08-30 NOTE — Care Management Note (Signed)
Case Management Note  Patient Details  Name: Ralph Ross MRN: [id number] Date of Birth: 11/01/1995  Subjective/Objective:     Patient is from home with mother.  Placed in observation for syncope.   Independent in all adls, denies issues accessing medical care, obtaining medications or with transportation.  Current with P[facility], [facility].  Obtains prescriptions there as well and they offer financial assistance as patient does not have medical insurance.  He is working part time with [employer] and will transition to full time in January and will receive benefits at that time.  No discharge needs identified at present by care manager or members of care team.                 Action/Plan:   Expected Discharge Date:                  Expected Discharge Plan:  Home/Self Care  In-House Referral:     Discharge planning Services  CM Consult  Post Acute Care Choice:    Choice offered to:     DME Arranged:    DME Agency:     HH Arranged:    HH Agency:     Status of Service:  Completed, signed off  If discussed at [employer] Length of Stay Meetings, dates discussed:    Additional Comments:  Sherren KernsJennifer L Genesia Caslin, RN 08/30/2018, 3:42 PM

## 2018-08-30 NOTE — H&P (Addendum)
[facility] - [place] at [facility]   PATIENT NAME: Ralph Ross    MR#:  [id number]  DATE OF BIRTH:  08/03/1996  DATE OF ADMISSION:  08/30/2018  PRIMARY CARE PHYSICIAN: [facility]   REQUESTING/REFERRING PHYSICIAN: Loleta RoseForbach, Cory, MD  CHIEF COMPLAINT:   Chief Complaint  Patient presents with  . Loss of Consciousness    HISTORY OF PRESENT ILLNESS:  Ralph Ross  is a 22 y.o. male with a known history of adrenal insufficiency p/w syncope. Pt states that his medication regimen was changed ~1-2wks ago, from prednisone to hydrocortisone + fludrocortisone. He states he was well and in his usual state of health until Wednesday (08/29/2018) evening. He states he went to work during the day, left @~2230PM, and got home @~2300PM. He states he was on FaceTime with his sister, when he felt a wave of nausea pass over him. He states he kneeled in front of the toilet to vomit, and developed severe diffuse AP, "pulling"/"cramping" in character. He states he put his head in the sink and ran water over his head. He began to feel lightheaded. He tried to get into the shower, and lost consciousness. His mother called EMS. He denies any significant pain/injury 2/2 LOC. BP WNL. No other complaints. Not in distress.  PAST MEDICAL HISTORY:   Past Medical History:  Diagnosis Date  . Addison disease (HCC) unk    PAST SURGICAL HISTORY:  History reviewed. No pertinent surgical history.  SOCIAL HISTORY:   Social History   Tobacco Use  . Smoking status: Never Smoker  . Smokeless tobacco: Never Used  Substance Use Topics  . Alcohol use: No    FAMILY HISTORY:  History reviewed. No pertinent family history.  DRUG ALLERGIES:  No Known Allergies  REVIEW OF SYSTEMS:   Review of Systems  Constitutional: Negative for chills, diaphoresis, fever, malaise/fatigue and weight loss.  HENT: Negative for congestion, ear pain, hearing loss, nosebleeds,  sinus pain, sore throat and tinnitus.   Eyes: Negative for blurred vision, double vision and photophobia.  Respiratory: Negative for cough, hemoptysis, sputum production, shortness of breath and wheezing.   Cardiovascular: Negative for chest pain, palpitations, orthopnea, claudication, leg swelling and PND.  Gastrointestinal: Positive for abdominal pain and nausea. Negative for blood in stool, constipation, diarrhea, heartburn, melena and vomiting.  Genitourinary: Negative for dysuria, flank pain, frequency, hematuria and urgency.  Musculoskeletal: Positive for falls. Negative for back pain, joint pain, myalgias and neck pain.  Skin: Negative for itching and rash.  Neurological: Positive for dizziness and loss of consciousness. Negative for tingling, tremors, sensory change, speech change, focal weakness, seizures, weakness and headaches.  Psychiatric/Behavioral: Negative for memory loss. The patient does not have insomnia.    MEDICATIONS AT HOME:   Prior to Admission medications   Medication Sig Start Date End Date Taking? Authorizing Provider  fludrocortisone (FLORINEF) 0.1 MG tablet Take 0.1 mg by mouth daily. 08/03/18  Yes [provider]  hydrocortisone (CORTEF) 10 MG tablet Take 10 mg by mouth every evening. As early as 3pm 08/03/18  Yes [provider]  hydrocortisone (CORTEF) 20 MG tablet Take 20 mg by mouth daily. 08/03/18  Yes [provider]  HYDROcodone-acetaminophen (NORCO) 5-325 MG tablet Take 1-2 tablets by mouth every 4 (four) hours as needed for moderate pain. Patient not taking: Reported on 08/30/2018 02/29/16   Beers, Charmayne Sheerharles M, PA-C  ibuprofen (ADVIL,MOTRIN) 800 MG tablet Take 1 tablet (800 mg total) by mouth every 8 (eight)  hours as needed. Patient not taking: Reported on 08/30/2018 02/29/16   Beers, Charmayne Sheer, PA-C  ranitidine (ZANTAC) 150 MG tablet Take 1 tablet (150 mg total) by mouth 2 (two) times daily. Patient not taking: Reported on 08/30/2018  03/22/16   Menshew, Charlesetta Ivory, PA-C  traMADol (ULTRAM) 50 MG tablet Take 1 tablet (50 mg total) by mouth every 6 (six) hours as needed for moderate pain. Patient not taking: Reported on 08/30/2018 04/10/17   Joni Reining, PA-C  triamcinolone ointment (KENALOG) 0.1 % Apply 1 application topically 2 (two) times daily. Patient not taking: Reported on 08/30/2018 03/22/16   Menshew, Charlesetta Ivory, PA-C      VITAL SIGNS:  Blood pressure 124/90, pulse 60, temperature 98.5 F (36.9 C), temperature source Oral, resp. rate (!) 21, SpO2 98 %.  PHYSICAL EXAMINATION:  Physical Exam  Constitutional: He is oriented to person, place, and time. He appears well-developed and well-nourished. He is active and cooperative.  Non-toxic appearance. He does not have a sickly appearance. He does not appear ill. No distress. He is not intubated.  HENT:  Head: Normocephalic and atraumatic.  Mouth/Throat: Oropharynx is clear and moist. No oropharyngeal exudate.  Eyes: Conjunctivae, EOM and lids are normal. No scleral icterus.  Neck: Neck supple. No JVD present. No thyromegaly present.  Cardiovascular: Normal rate, regular rhythm, S1 normal, S2 normal and normal heart sounds.  No extrasystoles are present. Exam reveals no gallop, no S3, no S4, no distant heart sounds and no friction rub.  No murmur heard. Pulmonary/Chest: Effort normal and breath sounds normal. No accessory muscle usage or stridor. No apnea, no tachypnea and no bradypnea. He is not intubated. No respiratory distress. He has no decreased breath sounds. He has no wheezes. He has no rhonchi. He has no rales.  Abdominal: Soft. Bowel sounds are normal. He exhibits no distension. There is no tenderness. There is no rigidity, no rebound and no guarding.  Musculoskeletal: Normal range of motion. He exhibits no edema or tenderness.  Lymphadenopathy:    He has no cervical adenopathy.  Neurological: He is alert and oriented to person, place, and time. He is  not disoriented.  Skin: Skin is warm, dry and intact. No rash noted. He is not diaphoretic. No erythema.  Psychiatric: He has a normal mood and affect. His speech is normal and behavior is normal. Judgment and thought content normal. Cognition and memory are normal.   LABORATORY PANEL:   CBC Recent Labs  Lab 08/30/18 0411  WBC 9.3  HGB 13.3  HCT 39.1  PLT 218   ------------------------------------------------------------------------------------------------------------------  Chemistries  Recent Labs  Lab 08/30/18 0411  NA 144  K 2.7*  CL 108  CO2 25  GLUCOSE 98  BUN 10  CREATININE 0.69  CALCIUM 9.3  MG 2.1  AST 27  ALT 17  ALKPHOS 89  BILITOT 0.4   ------------------------------------------------------------------------------------------------------------------  Cardiac Enzymes Recent Labs  Lab 08/30/18 0411  TROPONINI <0.03   ------------------------------------------------------------------------------------------------------------------  RADIOLOGY:  No results found.  IMPRESSION AND PLAN:   A/P: 26M w/ Hx adrenal insufficiency p/w AP/N, syncope, hypokalemia. -Adrenal insufficiency, AP/N, syncope, hypokalemia: Syncope x1. BP WNL. K+ 2.7. Suspect Fludrocortisone dose insufficient. Increased to 0.2. I don't know how to call an Endocrinology consult, but pt is expected to benefit from one. Orthostatic VS, FSG qHS/AC, neuro checks q4h x24hrs, fall precautions. Tele, continuous cardiac monitoring. Trop-I (-), rpt pending. EKG (-). Echo pending. Replete K+. Mag WNL. IVF. -FEN/GI: Regular diet. -DVT PPx:  Lovenox. -Code status: Full code. -Disposition: Observation, < 2 midnights.   All the records are reviewed and case discussed with ED provider. Management plans discussed with the patient, family and they are in agreement.  CODE STATUS: Full code.  TOTAL TIME TAKING CARE OF THIS PATIENT: 75 minutes.    Barbaraann Rondo M.D on 08/30/2018 at 6:09  AM  Between 7am to 6pm - Pager - [phone]  After 6pm go to [email or web address] - Scientist, research (life sciences) [facility]  Office  [phone]  CC: Primary care physician; [facility]   Note: This dictation was prepared with Nurse, children's dictation along with smaller phrase technology. Any transcriptional errors that result from this process are unintentional.

## 2018-08-30 NOTE — ED Triage Notes (Signed)
Pt arrived via [facility] from home with c/o syncope. EMS states that pt has hx of adrenal insufficiency and they recently changed his medications. EMS states pt was going to restroom and then passed out. EMS states that pt been diaphoretic and "out of it."

## 2018-08-30 NOTE — ED Provider Notes (Signed)
[facility] Emergency Department Provider Note  ____________________________________________   First MD Initiated Contact with Patient 08/30/18 [phone]     (approximate)  I have reviewed the triage vital signs and the nursing notes.   HISTORY  Chief Complaint Loss of Consciousness   Level 5 caveat:  history/ROS limited by acute/critical illness and confusion   HPI Ralph Ross is a 22 y.o. male with a history of congenital adrenal insufficiency who presents by EMS for evaluation of syncope and collapse and concern for possible adrenal crisis.  The patient is not able to provide much history except that he has not been feeling well for the last couple of days with some generalized abdominal cramping and then he passed out just prior to EMS transport.  He says that he feels dizzy.  He is obviously confused and not able to provide much information.  His mother showed up soon and was able to provide additional details including confirming his diagnosis of adrenal insufficiency.  His primary care provider recently changed him off of the prednisone he had been on for years and onto fludrocortisone and hydrocortisone as well as one other medication.  She thought he would tolerate this better but his mother is concerned that he is not tolerating the new medication regimen that was changed within the last couple weeks and that this may be the result.  She denies that he has had any fever/chills, chest pain, shortness of breath, and the patient denies dysuria.  The symptoms are severe and nothing in particular make them better nor worse.  He reportedly lost bladder control during his syncopal episode.  Past Medical History:  Diagnosis Date  . Addison disease [facility]) unk    Patient Active Problem List   Diagnosis Date Noted  . Syncope 08/30/2018    History reviewed. No pertinent surgical history.  Prior to Admission medications   Medication Sig Start Date End Date  Taking? Authorizing Provider  fludrocortisone (FLORINEF) 0.1 MG tablet Take 0.1 mg by mouth daily. 08/03/18  Yes [provider]  hydrocortisone (CORTEF) 10 MG tablet Take 10 mg by mouth every evening. As early as 3pm 08/03/18  Yes [provider]  hydrocortisone (CORTEF) 20 MG tablet Take 20 mg by mouth daily. 08/03/18  Yes [provider]  HYDROcodone-acetaminophen (NORCO) 5-325 MG tablet Take 1-2 tablets by mouth every 4 (four) hours as needed for moderate pain. Patient not taking: Reported on 08/30/2018 02/29/16   Beers, Charmayne Sheer, PA-C  ibuprofen (ADVIL,MOTRIN) 800 MG tablet Take 1 tablet (800 mg total) by mouth every 8 (eight) hours as needed. Patient not taking: Reported on 08/30/2018 02/29/16   Beers, Charmayne Sheer, PA-C  ranitidine (ZANTAC) 150 MG tablet Take 1 tablet (150 mg total) by mouth 2 (two) times daily. Patient not taking: Reported on 08/30/2018 03/22/16   Menshew, Charlesetta Ivory, PA-C  traMADol (ULTRAM) 50 MG tablet Take 1 tablet (50 mg total) by mouth every 6 (six) hours as needed for moderate pain. Patient not taking: Reported on 08/30/2018 04/10/17   Joni Reining, PA-C  triamcinolone ointment (KENALOG) 0.1 % Apply 1 application topically 2 (two) times daily. Patient not taking: Reported on 08/30/2018 03/22/16   Menshew, Charlesetta Ivory, PA-C    Allergies Patient has no known allergies.  History reviewed. No pertinent family history.  Social History Social History   Tobacco Use  . Smoking status: Never Smoker  . Smokeless tobacco: Never Used  Substance Use Topics  .  Alcohol use: No  . Drug use: No    Review of Systems Level 5 caveat:  history/ROS limited by acute/critical illness and confusion, see above for details  ____________________________________________   PHYSICAL EXAM:  VITAL SIGNS: ED Triage Vitals  Enc Vitals Group     BP 08/30/18 0358 (!) 136/104     Pulse Rate 08/30/18 0358 63     Resp 08/30/18 0358 18     Temp 08/30/18 0358  98.5 F (36.9 C)     Temp Source 08/30/18 0358 Oral     SpO2 08/30/18 0358 100 %     Weight --      Height --      Head Circumference --      Peak Flow --      Pain Score 08/30/18 0357 0     Pain Loc --      Pain Edu? --      Excl. in GC? --     Constitutional: Alert and oriented to self and location but is otherwise confused.  Generally well appearing and in no acute distress. Eyes: Conjunctivae are normal.  Head: Atraumatic. Nose: No congestion/rhinnorhea. Mouth/Throat: Mucous membranes are moist. Neck: No stridor.  No meningeal signs.   Cardiovascular: Normal rate, regular rhythm. Good peripheral circulation. Grossly normal heart sounds. Respiratory: Normal respiratory effort.  No retractions. Lungs CTAB. Gastrointestinal: Soft and nontender. No distention.  Musculoskeletal: No lower extremity tenderness nor edema. No gross deformities of extremities. Neurologic:  Normal speech and language. No gross focal neurologic deficits are appreciated.  Skin:  Skin is warm, dry and intact. No rash noted. Psychiatric: Mood and affect are confused and disoriented.  ____________________________________________   LABS (all labs ordered are listed, but only abnormal results are displayed)  Labs Reviewed  COMPREHENSIVE METABOLIC PANEL - Abnormal; Notable for the following components:      Result Value   Potassium 2.7 (*)    All other components within normal limits  ACETAMINOPHEN LEVEL - Abnormal; Notable for the following components:   Acetaminophen (Tylenol), Serum <10 (*)    All other components within normal limits  URINALYSIS, COMPLETE (UACMP) WITH MICROSCOPIC - Abnormal; Notable for the following components:   Color, Urine COLORLESS (*)    APPearance CLEAR (*)    Specific Gravity, Urine 1.003 (*)    All other components within normal limits  URINE DRUG SCREEN, QUALITATIVE ([facility] ONLY) - Abnormal; Notable for the following components:   Cannabinoid 50 Ng, Ur [place] POSITIVE (*)     All other components within normal limits  URINE CULTURE  ETHANOL  LIPASE, BLOOD  CBC WITH DIFFERENTIAL/PLATELET  SALICYLATE LEVEL  TROPONIN I  MAGNESIUM  PHOSPHORUS  GLUCOSE, CAPILLARY  TROPONIN I  TROPONIN I  HIV ANTIBODY (ROUTINE TESTING W REFLEX)  I-STAT CG4 LACTIC ACID, ED  CG4 I-STAT (LACTIC ACID)   ____________________________________________  EKG  ED ECG REPORT I, Loleta Rose, the attending physician, personally viewed and interpreted this ECG.  Date: 08/30/2018 EKG Time: 4:07 AM Rate: 61 Rhythm: normal sinus rhythm QRS Axis: normal Intervals: normal ST/T Wave abnormalities: Nonspecific ST changes but no evidence of clear ischemia Narrative Interpretation: no evidence of acute ischemia  ____________________________________________  RADIOLOGY   ED MD interpretation: No indication for acute imaging  Official radiology report(s): No results found.  ____________________________________________   PROCEDURES  Critical Care performed: No   Procedure(s) performed:   Procedures   ____________________________________________   INITIAL IMPRESSION / ASSESSMENT AND PLAN / ED COURSE  As part  of my medical decision making, I reviewed the following data within the electronic MEDICAL RECORD NUMBER History obtained from family, Labs reviewed , EKG interpreted , Old chart reviewed and Discussed with admitting physician     Differential diagnosis includes, but is not limited to, adrenal crisis, acute infectious process, drug or medication overdose, less likely meningitis/encephalitis.  The patient is afebrile and is persistently bradycardic in the 50s but this may be appropriate given his body habitus.  However the symptoms he is complaining of, specifically the generalized abdominal cramping, syncope, and confusion, are consistent with adrenal crisis and he has well known and documented adrenal insufficiency with recent medication changes.  Given that he could  decompensate to shock relatively quickly, I treated aggressively with hydrocortisone 100 mg IV bolus and a liter of normal saline.  Lab work is all pending but anticipate that he will need admission for further evaluation and management of adrenal crisis and to try to get him on a good medication regimen and make sure he does not become hypotensive.  The patient and the mother agree with this plan.  No indication for acute imaging at this time; the patient has not had any acute signs of infection and does not have any focal neurological deficits to warrant a CT head.  Clinical Course as of Aug 30 824  Thu Aug 30, 2018  0427 Lactic Acid, Venous: 1.45 [CF]  0525 The patient's lab work is all generally reassuring and his electrolytes are normal except for potassium of 2.7 of unclear significance.  He has no sodium abnormality and I have added on magnesium and phosphorus levels.The patient is acting more coherent, and it is unclear if he was postictal previously or if his confusion was secondary to renal crisis.  However given that we know he has a diagnosis of congenital adrenal insufficiency and he was showing most of the signs and symptoms of adrenal crisis, I think it is appropriate to bring him into the hospital for continued monitoring and additional evaluation and treatment recommendations to see if he would benefit from going back on prednisone or some other commendation of medications.  He also needs both oral and IV repletion of his potassium.I explained this to the patient and he and his mother understand and agree with the plan.  I will discuss case with the hospitalist.  Potassium(!!): 2.7 [CF]  0531 Cannabinoid 50 Ng, Ur [place](!): POSITIVE [CF]    Clinical Course User Index [CF] Loleta RoseForbach, Tabithia Stroder, MD    ____________________________________________  FINAL CLINICAL IMPRESSION(S) / ED DIAGNOSES  Final diagnoses:  Acute adrenal crisis (HCC)  Syncope and collapse  Hypokalemia  Confusion      MEDICATIONS GIVEN DURING THIS VISIT:  Medications  potassium chloride 10 mEq in 100 mL IVPB (10 mEq Intravenous New Bag/Given 08/30/18 0707)  sodium chloride flush (NS) 0.9 % injection 3 mL (has no administration in time range)  enoxaparin (LOVENOX) injection 40 mg (has no administration in time range)  acetaminophen (TYLENOL) tablet 650 mg (has no administration in time range)    Or  acetaminophen (TYLENOL) suppository 650 mg (has no administration in time range)  senna-docusate (Senokot-S) tablet 1 tablet (has no administration in time range)  bisacodyl (DULCOLAX) EC tablet 5 mg (has no administration in time range)  ondansetron (ZOFRAN) tablet 4 mg (has no administration in time range)    Or  ondansetron (ZOFRAN) injection 4 mg (has no administration in time range)  alum & mag hydroxide-simeth (MAALOX/MYLANTA) 200-200-20 MG/5ML suspension 15  mL (has no administration in time range)  hydrocortisone (CORTEF) tablet 10 mg (has no administration in time range)  hydrocortisone (CORTEF) tablet 20 mg (has no administration in time range)  potassium chloride SA (K-DUR,KLOR-CON) CR tablet 40 mEq (has no administration in time range)  fludrocortisone (FLORINEF) tablet 0.2 mg (has no administration in time range)  lactated ringers infusion (has no administration in time range)  sodium chloride 0.9 % bolus 1,000 mL (0 mLs Intravenous Stopped 08/30/18 0553)  hydrocortisone sodium succinate (SOLU-CORTEF) 100 MG injection 100 mg (100 mg Intravenous Given 08/30/18 0415)  potassium chloride SA (K-DUR,KLOR-CON) CR tablet 40 mEq (40 mEq Oral Given 08/30/18 0533)  0.9 %  sodium chloride infusion ( Intravenous New Bag/Given 08/30/18 0706)     ED Discharge Orders    None       Note:  This document was prepared using Dragon voice recognition software and may include unintentional dictation errors.    Loleta Rose, MD 08/30/18 [phone]

## 2018-08-30 NOTE — ED Notes (Signed)
Date and time results received: 08/30/18 5:05 AM  Test: Potassium Critical Value: K: 2.7  Name of Provider Notified: Dr York CeriseForbach  Orders Received? Or Actions Taken?: Acknowledged

## 2018-08-30 NOTE — ED Notes (Signed)
Pt not wanting to comply with IV insertion and treatment. Waiting for mother and MD to bedside.

## 2018-08-31 LAB — BASIC METABOLIC PANEL
Anion gap: 5 (ref 5–15)
BUN: 12 mg/dL (ref 6–20)
CO2: 27 mmol/L (ref 22–32)
Calcium: 8.8 mg/dL — ABNORMAL LOW (ref 8.9–10.3)
Chloride: 111 mmol/L (ref 98–111)
Creatinine, Ser: 0.86 mg/dL (ref 0.61–1.24)
GFR calc Af Amer: >60 mL/min (ref >60)
GFR calc non Af Amer: >60 mL/min (ref >60)
Glucose, Bld: 96 mg/dL (ref 70–99)
Potassium: 3.5 mmol/L (ref 3.5–5.1)
SODIUM: 143 mmol/L (ref 135–145)

## 2018-08-31 LAB — GLUCOSE, CAPILLARY
Glucose-Capillary: 84 mg/dL (ref 70–99)
Glucose-Capillary: 88 mg/dL (ref 70–99)

## 2018-08-31 LAB — URINE CULTURE: Culture: NO GROWTH

## 2018-08-31 LAB — HIV ANTIBODY (ROUTINE TESTING W REFLEX): HIV Screen 4th Generation wRfx: NONREACTIVE

## 2018-08-31 MED ORDER — FLUDROCORTISONE ACETATE 0.1 MG PO TABS: 0.2000 mg | ORAL_TABLET | Freq: Every day | ORAL | 0 refills | Status: AC

## 2018-08-31 NOTE — Progress Notes (Signed)
Advanced care plan.  Purpose of the Encounter: CODE STATUS  Parties in Attendance: Patient and family  Patient's Decision Capacity: Good  Subjective/Patient's story: Presented to the emergency room for weakness and passing out  Objective/Medical story Has dehydration, renal insufficiency and syncope Needs Florinef and Cortef IV fluids  Goals of care determination:  Advance care directives goals of care treatment plan discussed Patient wants everything done which includes CPR, intubation and ventilator the need arises   CODE STATUS: Full code   Time spent discussing advanced care planning: 16 minutes

## 2018-08-31 NOTE — Discharge Summary (Signed)
[employer] - [place] at [facility]   PATIENT NAME: Ralph Ross    MR#:  [id number]  DATE OF BIRTH:  [birth year]  DATE OF ADMISSION:  08/30/2018 ADMITTING PHYSICIAN: Barbaraann Rondo, MD  DATE OF DISCHARGE: 08/31/2018  PRIMARY CARE PHYSICIAN: [facility]   ADMISSION DIAGNOSIS:  Syncope and collapse [R55] Hypokalemia [E87.6] Confusion [R41.0] Acute adrenal crisis (HCC) [E27.2]  DISCHARGE DIAGNOSIS:  Active Problems:   Syncope Adrenal insufficiency Hypokalemia Dehydration  SECONDARY DIAGNOSIS:   Past Medical History:  Diagnosis Date  . Addison disease (HCC) unk     ADMITTING HISTORY Antonios Ostrow  is a 22 y.o. male with a known history of adrenal insufficiency p/w syncope. Pt states that his medication regimen was changed ~1-2wks ago, from prednisone to hydrocortisone + fludrocortisone. He states he was well and in his usual state of health until Wednesday (08/29/2018) evening. He states he went to work during the day, left @~2230PM, and got home @~2300PM. He states he was on FaceTime with his sister, when he felt a wave of nausea pass over him. He states he kneeled in front of the toilet to vomit, and developed severe diffuse AP, "pulling"/"cramping" in character. He states he put his head in the sink and ran water over his head. He began to feel lightheaded. He tried to get into the shower, and lost consciousness. His mother called EMS. He denies any significant pain/injury 2/2 LOC. BP WNL. No other complaints. Not in distress.   HOSPITAL COURSE:  Was admitted to medical floor with cardiac monitoring.  Potassium was aggressively replaced.  She received IV fluid hydration.  His Florinef was increased to 0.2 mg daily.  Patient continued oral Cortef.  He follows up with endocrinologist at [facility] for his adrenal insufficiency.  His dizziness improved.  Dehydration completely resolved and potassium normalized.  Patient will  be discharged home.  CONSULTS OBTAINED:  Treatment Team:  Barbaraann Rondo, MD  DRUG ALLERGIES:  No Known Allergies  DISCHARGE MEDICATIONS:   Allergies as of 08/31/2018   No Known Allergies     Medication List    STOP taking these medications   HYDROcodone-acetaminophen 5-325 MG tablet Commonly known as:  NORCO/VICODIN   ibuprofen 800 MG tablet Commonly known as:  ADVIL,MOTRIN   ranitidine 150 MG tablet Commonly known as:  ZANTAC   traMADol 50 MG tablet Commonly known as:  ULTRAM   triamcinolone ointment 0.1 % Commonly known as:  KENALOG     TAKE these medications   fludrocortisone 0.1 MG tablet Commonly known as:  FLORINEF Take 2 tablets (0.2 mg total) by mouth daily. What changed:  how much to take   hydrocortisone 20 MG tablet Commonly known as:  CORTEF Take 20 mg by mouth daily.   hydrocortisone 10 MG tablet Commonly known as:  CORTEF Take 10 mg by mouth every evening. As early as 3pm       Today  Patient seen today No vomitings No nausea No chest pain  VITAL SIGNS:  Blood pressure 112/72, pulse (!) 49, temperature 98 F (36.7 C), temperature source Oral, resp. rate 20, height 5\' 8"  (1.727 m), weight 71.4 kg, SpO2 100 %.  I/O:    Intake/Output Summary (Last 24 hours) at 08/31/2018 1102 Last data filed at 08/31/2018 0617 Gross per 24 hour  Intake -  Output 1600 ml  Net -1600 ml    PHYSICAL EXAMINATION:  Physical Exam  GENERAL:  22 y.o.-year-old patient lying in the bed  with no acute distress.  LUNGS: Normal breath sounds bilaterally, no wheezing, rales,rhonchi or crepitation. No use of accessory muscles of respiration.  CARDIOVASCULAR: S1, S2 normal. No murmurs, rubs, or gallops.  ABDOMEN: Soft, non-tender, non-distended. Bowel sounds present. No organomegaly or mass.  NEUROLOGIC: Moves all 4 extremities. PSYCHIATRIC: The patient is alert and oriented x 3.  SKIN: No obvious rash, lesion, or ulcer.   DATA REVIEW:   CBC Recent  Labs  Lab 08/30/18 0411  WBC 9.3  HGB 13.3  HCT 39.1  PLT 218    Chemistries  Recent Labs  Lab 08/30/18 0411  08/31/18 0410  NA 144  --  143  K 2.7*   < > 3.5  CL 108  --  111  CO2 25  --  27  GLUCOSE 98  --  96  BUN 10  --  12  CREATININE 0.69  --  0.86  CALCIUM 9.3  --  8.8*  MG 2.1  --   --   AST 27  --   --   ALT 17  --   --   ALKPHOS 89  --   --   BILITOT 0.4  --   --    < > = values in this interval not displayed.    Cardiac Enzymes Recent Labs  Lab 08/30/18 1043  TROPONINI <0.03    Microbiology Results  Results for orders placed or performed during the hospital encounter of 08/30/18  Urine culture     Status: None   Collection Time: 08/30/18  4:46 AM  Result Value Ref Range Status   Specimen Description   Final    URINE, RANDOM Performed at [facility], [address] [place], [place] [address]    Special Requests   Final    NONE Performed at [facility], [address] [place], [place] [address]    Culture   Final    NO GROWTH Performed at [facility], [address] [place], [place] [address]    Report Status 08/31/2018 FINAL  Final    RADIOLOGY:  No results found.  Follow up with PCP in 1 week.  Management plans discussed with the patient, family and they are in agreement.  CODE STATUS: Full code    Code Status Orders  (From admission, onward)         Start     Ordered   08/30/18 0646  Full code  Continuous     08/30/18 0645        Code Status History    This patient has a current code status but no historical code status.      TOTAL TIME TAKING CARE OF THIS PATIENT ON DAY OF DISCHARGE: more than 34 minutes.   Ihor AustinPavan  M.D on 08/31/2018 at 11:02 AM  Between 7am to 6pm - Pager - [phone]  After 6pm go to [email or web address] - password [email or web address] [facility]  [facility] [place] [facility]  Office  [phone]  CC: Primary care physician; [facility]  Note: This  dictation was prepared with Nurse, children'sDragon dictation along with smaller phrase technology. Any transcriptional errors that result from this process are unintentional.

## 2018-08-31 NOTE — Progress Notes (Signed)
[employer] - [place] at [facility]    Ralph Ross was admitted to the Hospital on 08/30/2018 and Discharged  08/31/2018 and should be excused from work/school for above  days , may return to work/school without any restrictions form 09/01/2018  Call [name] MD with questions.  [name] M.D on 08/31/2018,at 11:01 AM  [facility] [place] at [facility]    Office  [phone]

## 2018-08-31 NOTE — Plan of Care (Signed)
Patient states his appetite has been poor, provide snacks between meals.

## 2019-02-04 ENCOUNTER — Other Ambulatory Visit: Payer: Self-pay

## 2019-02-04 ENCOUNTER — Emergency Department: Payer: Self-pay

## 2019-02-04 ENCOUNTER — Emergency Department
Admission: EM | Admit: 2019-02-04 | Discharge: 2019-02-04 | Disposition: A | Discharge status: Home / Self Care | Payer: Self-pay | Attending: Emergency Medicine | Admitting: Emergency Medicine

## 2019-02-04 ENCOUNTER — Encounter: Payer: Self-pay | Admitting: *Deleted

## 2019-02-04 DIAGNOSIS — M25512 Pain in left shoulder: Secondary | ICD-10-CM | POA: Insufficient documentation

## 2019-02-04 DIAGNOSIS — Z79899 Other long term (current) drug therapy: Secondary | ICD-10-CM | POA: Insufficient documentation

## 2019-02-04 MED ORDER — MELOXICAM 15 MG PO TABS: 15.0000 mg | ORAL_TABLET | Freq: Every day | ORAL | 1 refills | Status: AC

## 2019-02-04 NOTE — ED Triage Notes (Signed)
Pt has left shoulder pain.  Sx for 3 days.  pain with movement.  No known injury to shoulder.  Pt alert

## 2019-02-04 NOTE — ED Provider Notes (Signed)
[facility] Emergency Department Provider Note  ____________________________________________  Time seen: Approximately 10:51 PM  I have reviewed the triage vital signs and the nursing notes.   HISTORY  Chief Complaint Shoulder Pain    HPI Ralph Ross is a 23 y.o. male presents to the emergency department with acute left shoulder pain for the past 2 to 3 days.  Patient reports that he has recently been lifting more than usual.  He denies falls or mechanisms of trauma.  He reports that pain occurs across his entire shoulder but does not radiate down the arm.  He denies numbness or tingling of the upper extremities.  No subjective weakness.  Patient reports that pain worsened tonight while at work and he had to leave early.  He rates his pain a 10 on a 10 in intensity and describes it as aching.  Patient has been taking Tylenol for pain and applying heat.        Past Medical History:  Diagnosis Date  . Addison disease [facility]) unk    Patient Active Problem List   Diagnosis Date Noted  . Syncope 08/30/2018    No past surgical history on file.  Prior to Admission medications   Medication Sig Start Date End Date Taking? Authorizing Provider  hydrocortisone (CORTEF) 10 MG tablet Take 10 mg by mouth every evening. As early as 3pm 08/03/18   [provider]  hydrocortisone (CORTEF) 20 MG tablet Take 20 mg by mouth daily. 08/03/18   [provider]  meloxicam (MOBIC) 15 MG tablet Take 1 tablet (15 mg total) by mouth daily for 7 days. 02/04/19 02/11/19  Orvil Feil, PA-C    Allergies Patient has no known allergies.  No family history on file.  Social History Social History   Tobacco Use  . Smoking status: Never Smoker  . Smokeless tobacco: Never Used  Substance Use Topics  . Alcohol use: Yes  . Drug use: No     Review of Systems  Constitutional: No fever/chills Eyes: No visual changes. No discharge ENT: No upper  respiratory complaints. Cardiovascular: no chest pain. Respiratory: no cough. No SOB. Gastrointestinal: No abdominal pain.  No nausea, no vomiting.  No diarrhea.  No constipation. Genitourinary: Negative for dysuria. No hematuria Musculoskeletal: Patient has left shoulder pain.  Skin: Negative for rash, abrasions, lacerations, ecchymosis. Neurological: Negative for headaches, focal weakness or numbness.   ____________________________________________   PHYSICAL EXAM:  VITAL SIGNS: ED Triage Vitals  Enc Vitals Group     BP 02/04/19 2130 118/62     Pulse Rate 02/04/19 2130 75     Resp 02/04/19 2130 18     Temp 02/04/19 2130 98.2 F (36.8 C)     Temp Source 02/04/19 2130 Oral     SpO2 02/04/19 2130 99 %     Weight 02/04/19 2131 160 lb (72.6 kg)     Height 02/04/19 2131 5\' 9"  (1.753 m)     Head Circumference --      Peak Flow --      Pain Score 02/04/19 2130 7     Pain Loc --      Pain Edu? --      Excl. in GC? --      Constitutional: Alert and oriented. Well appearing and in no acute distress. Eyes: Conjunctivae are normal. PERRL. EOMI. Head: Atraumatic. Cardiovascular: Normal rate, regular rhythm. Normal S1 and S2.  Good peripheral circulation. Respiratory: Normal respiratory effort without tachypnea or retractions. Lungs CTAB.  Good air entry to the bases with no decreased or absent breath sounds. Musculoskeletal: Patient is able to demonstrate full range of motion at the left shoulder.  He has pain with left rotator cuff testing but no weakness.  He performs full range of motion at the left elbow and left wrist.  Palpable radial pulse, left. Neurologic:  Normal speech and language. No gross focal neurologic deficits are appreciated.  Skin:  Skin is warm, dry and intact. No rash noted. Psychiatric: Mood and affect are normal. Speech and behavior are normal. Patient exhibits appropriate insight and judgement.   ____________________________________________   LABS (all  labs ordered are listed, but only abnormal results are displayed)  Labs Reviewed - No data to display ____________________________________________  EKG   ____________________________________________  RADIOLOGY I personally viewed and evaluated these images as part of my medical decision making, as well as reviewing the written report by the radiologist.  Dg Shoulder Left  Result Date: 02/04/2019 CLINICAL DATA:  Acute left shoulder pain. EXAM: LEFT SHOULDER - 2+ VIEW COMPARISON:  None. FINDINGS: There is no evidence of fracture or dislocation. There is no evidence of arthropathy or other focal bone abnormality. Soft tissues are unremarkable. IMPRESSION: No acute displaced fracture or dislocation. Electronically Signed   By: Katherine Mantlehristopher  Green M.D.   On: 02/04/2019 21:56    ____________________________________________    PROCEDURES  Procedure(s) performed:    Procedures    Medications - No data to display   ____________________________________________   INITIAL IMPRESSION / ASSESSMENT AND PLAN / ED COURSE  Pertinent labs & imaging results that were available during my care of the patient were reviewed by me and considered in my medical decision making (see chart for details).  Review of the [place] CSRS was performed in accordance of the [facility] prior to dispensing any controlled drugs.           Assessment and plan Left shoulder pain Patient presents to the emergency department with acute left shoulder pain that is occurred over the past 2 to 3 days.  On physical exam, patient had pain but no weakness with left rotator cuff testing.  Differential diagnosis included rotator cuff tendinitis, rotator cuff tear, biceps tendinitis and cervical radiculopathy.  History and physical exam findings are consistent with rotator cuff tendinitis.  I recommended application of ice and daily meloxicam.  Patient was advised to follow-up with orthopedics as needed.  All patient questions were  answered.     ____________________________________________  FINAL CLINICAL IMPRESSION(S) / ED DIAGNOSES  Final diagnoses:  Acute pain of left shoulder      NEW MEDICATIONS STARTED DURING THIS VISIT:  ED Discharge Orders         Ordered    meloxicam (MOBIC) 15 MG tablet  Daily     02/04/19 2232              This chart was dictated using voice recognition software/Dragon. Despite best efforts to proofread, errors can occur which can change the meaning. Any change was purely unintentional.    Orvil FeilWoods, Takoda Janowiak M, PA-C 02/04/19 2255    Dionne BucySiadecki, Sebastian, MD 02/04/19 2320

## 2019-02-04 NOTE — ED Notes (Signed)
Patient states hurt his left shoulder last week but don't know how. Over the weekend was taking ibuprofen and put cooling strips on left shoulder. While at work left shoulder was hurting really bad and arm was having a tingling and numb feeling down to finger tips. Patient states pain is 7/10.

## 2020-07-17 IMAGING — CR LEFT SHOULDER - 2+ VIEW
1 series · 3 of 3 positions shown · non-contrast
Comparison: None.

CLINICAL DATA: Acute left shoulder pain.

EXAM:
LEFT SHOULDER - 2+ VIEW

[Series 1: dg shoulder left · 0.14mm/px · 3 of 3 slices shown]
[im 1/3]
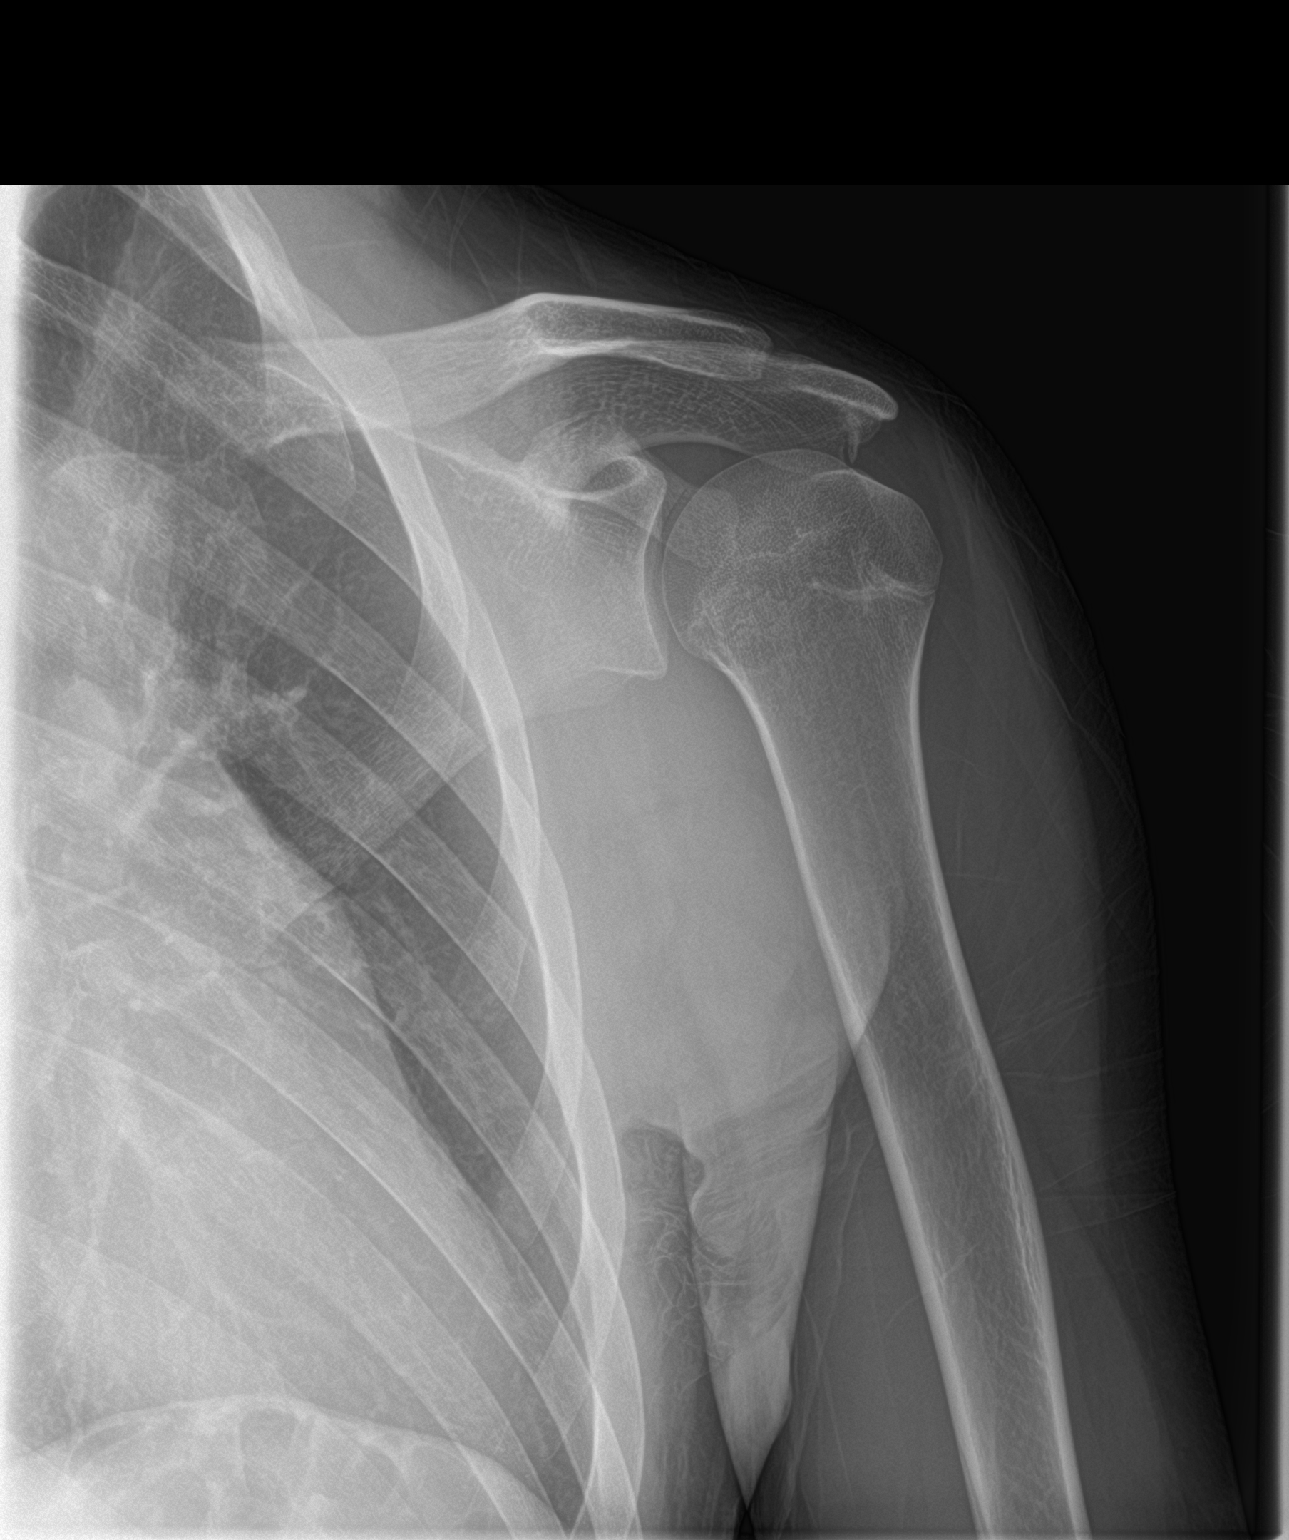
[im 2/3]
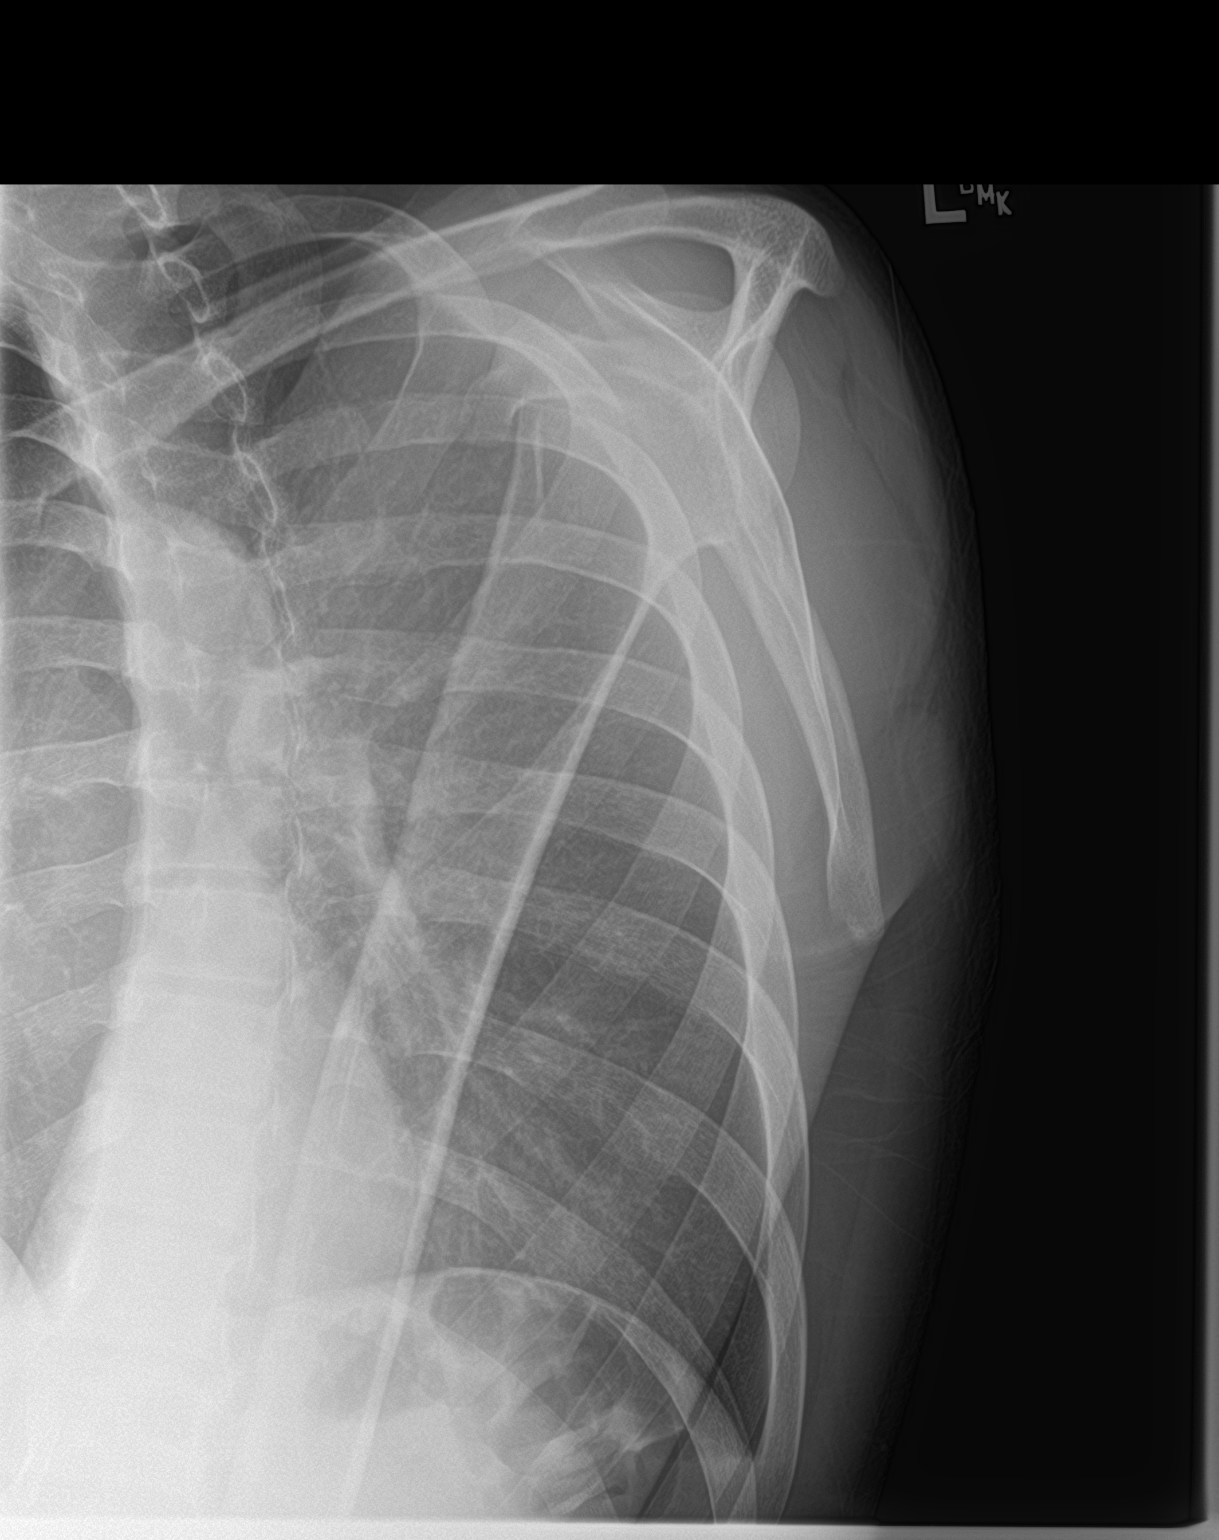
[im 3/3]
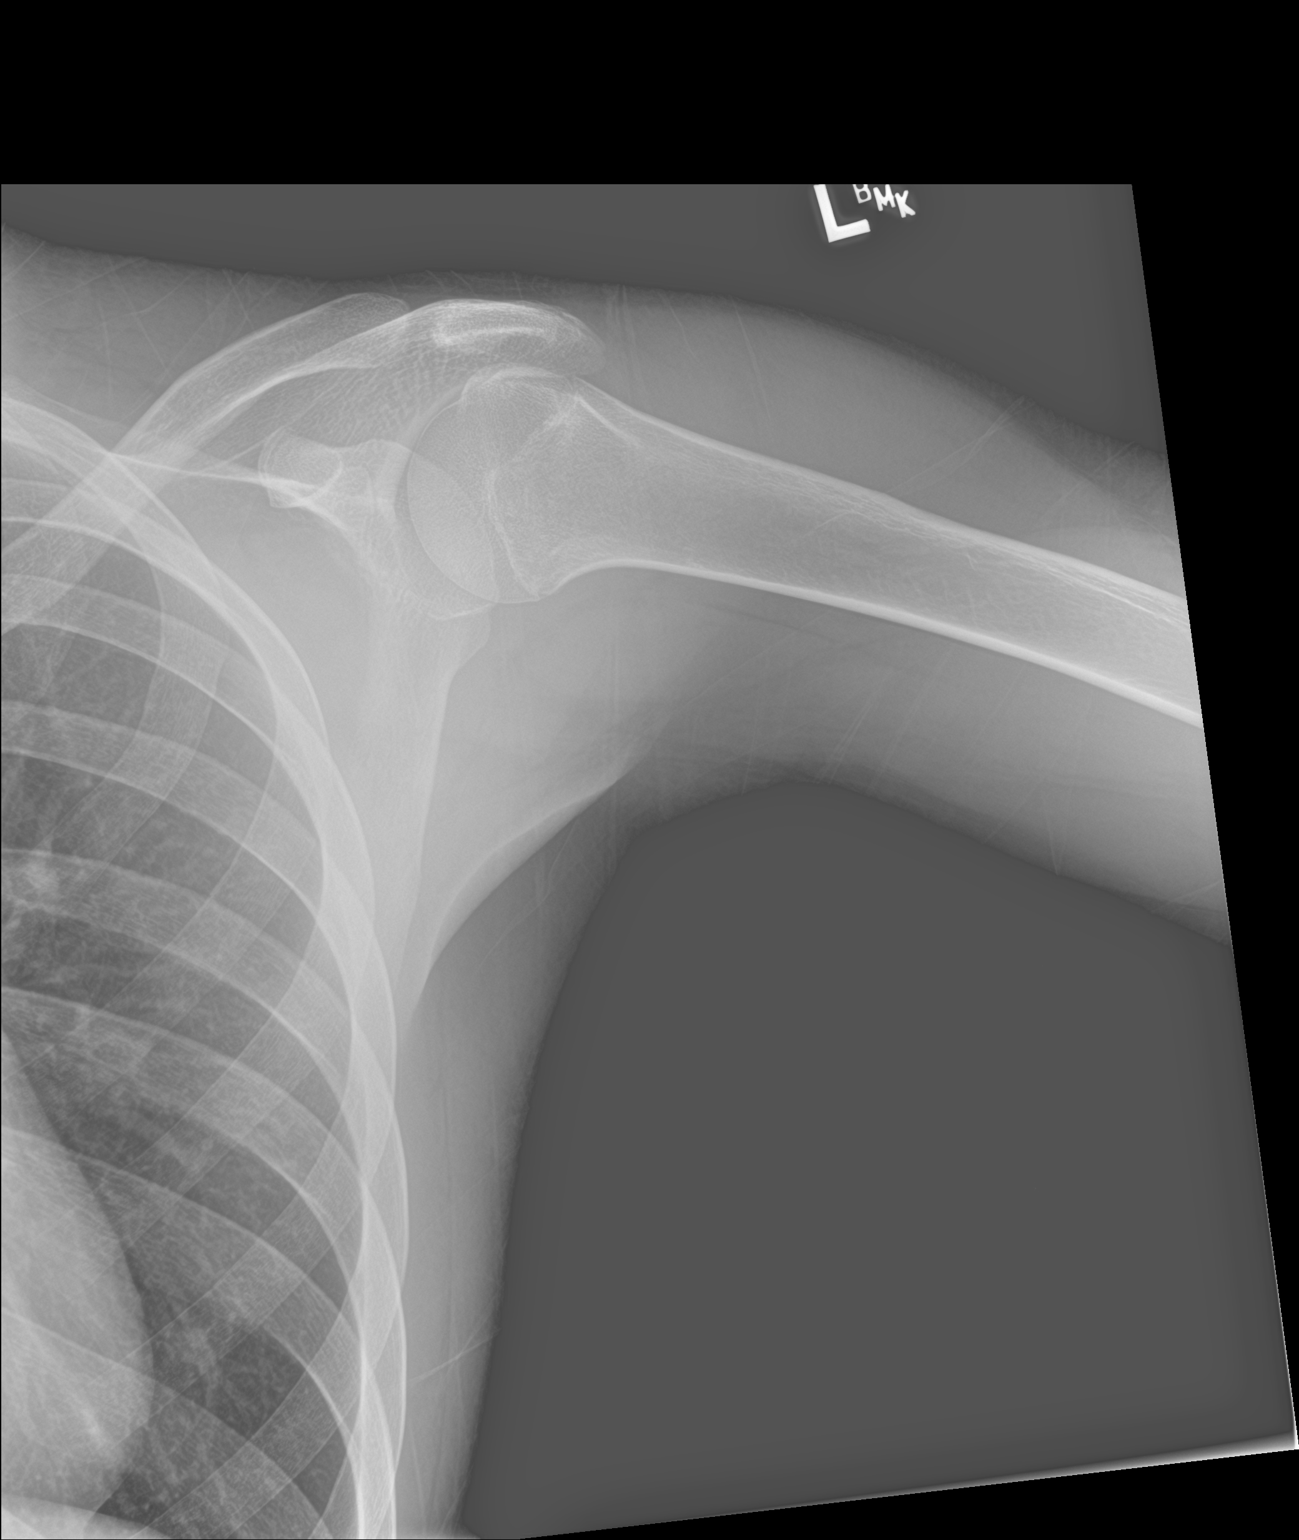

[3 of 3 positions shown; findings below may reference images not displayed]

FINDINGS: There is no evidence of fracture or dislocation. There is no
evidence of arthropathy or other focal bone abnormality. Soft
tissues are unremarkable.
IMPRESSION: No acute displaced fracture or dislocation.

## 2021-07-14 ENCOUNTER — Other Ambulatory Visit: Payer: Self-pay

## 2021-07-14 ENCOUNTER — Emergency Department
Admission: EM | Admit: 2021-07-14 | Discharge: 2021-07-14 | Disposition: A | Discharge status: Home / Self Care | Payer: Self-pay | Attending: Emergency Medicine | Admitting: Emergency Medicine

## 2021-07-14 DIAGNOSIS — R6883 Chills (without fever): Secondary | ICD-10-CM | POA: Insufficient documentation

## 2021-07-14 DIAGNOSIS — E611 Iron deficiency: Secondary | ICD-10-CM | POA: Insufficient documentation

## 2021-07-14 DIAGNOSIS — Z20822 Contact with and (suspected) exposure to covid-19: Secondary | ICD-10-CM | POA: Insufficient documentation

## 2021-07-14 LAB — CBC
HCT: 43.5 % (ref 39.0–52.0)
Hemoglobin: 15.9 g/dL (ref 13.0–17.0)
MCH: 32.6 pg (ref 26.0–34.0)
MCHC: 36.6 g/dL — ABNORMAL HIGH (ref 30.0–36.0)
MCV: 89.1 fL (ref 80.0–100.0)
Platelets: 192 K/uL (ref 150–400)
RBC: 4.88 MIL/uL (ref 4.22–5.81)
RDW: 11.8 % (ref 11.5–15.5)
WBC: 6.2 K/uL (ref 4.0–10.5)
nRBC: 0 % (ref 0.0–0.2)

## 2021-07-14 LAB — URINALYSIS, COMPLETE (UACMP) WITH MICROSCOPIC
Bacteria, UA: NONE SEEN
Bilirubin Urine: NEGATIVE
Glucose, UA: NEGATIVE mg/dL
Hgb urine dipstick: NEGATIVE
Ketones, ur: 80 mg/dL — AB
Leukocytes,Ua: NEGATIVE
Nitrite: NEGATIVE
Protein, ur: 100 mg/dL — AB
Specific Gravity, Urine: 1.025 (ref 1.005–1.030)
pH: 5 (ref 5.0–8.0)

## 2021-07-14 LAB — BASIC METABOLIC PANEL WITH GFR
Anion gap: 10 (ref 5–15)
BUN: 16 mg/dL (ref 6–20)
CO2: 25 mmol/L (ref 22–32)
Calcium: 9.8 mg/dL (ref 8.9–10.3)
Chloride: 98 mmol/L (ref 98–111)
Creatinine, Ser: 1.1 mg/dL (ref 0.61–1.24)
GFR, Estimated: >60 mL/min
Glucose, Bld: 86 mg/dL (ref 70–99)
Potassium: 3.4 mmol/L — ABNORMAL LOW (ref 3.5–5.1)
Sodium: 133 mmol/L — ABNORMAL LOW (ref 135–145)

## 2021-07-14 LAB — RESP PANEL BY RT-PCR (FLU A&B, COVID) ARPGX2
Influenza A by PCR: NEGATIVE
Influenza B by PCR: NEGATIVE
SARS Coronavirus 2 by RT PCR: NEGATIVE

## 2021-07-14 NOTE — ED Triage Notes (Signed)
Pt states was told has "low blood" a few months ago at check up, cannot say how low. States did not receive meds or blood transfusion. Was cold at work today and thinks it is low again.  Denies blood in urine or stool.  NAD noted

## 2021-07-14 NOTE — Discharge Instructions (Addendum)
Please make follow up appointment with endocrinology.

## 2021-07-14 NOTE — ED Provider Notes (Signed)
[facility]-EMERGENCY DEPARTMENT  ____________________________________________  Time seen: Approximately 7:28 PM  I have reviewed the triage vital signs and the nursing notes.   HISTORY  Chief Complaint low iron, cold   Historian Patient     HPI Ralph Ross is a 25 y.o. male with a history of Addison's disease, presents to the emergency department to have hemoglobin assessed stating that his blood counts were "low" several months ago when he has had some chills at home and was concerned that symptoms may be associated.  He denies chest pain, chest tightness, shortness of breath or syncope at home and states that he has been compliant with both his prednisone and fludrocortisone.  Denies viral URI-like symptoms.  No vomiting or diarrhea.  Patient denies abdominal pain.  No other alleviating measures have been attempted.   Past Medical History:  Diagnosis Date   Addison disease (HCC) unk     Immunizations up to date:  Yes.     Past Medical History:  Diagnosis Date   Addison disease [facility]) unk    Patient Active Problem List   Diagnosis Date Noted   Syncope 08/30/2018    History reviewed. No pertinent surgical history.  Prior to Admission medications   Medication Sig Start Date End Date Taking? Authorizing Provider  hydrocortisone (CORTEF) 10 MG tablet Take 10 mg by mouth every evening. As early as 3pm 08/03/18   [provider]  hydrocortisone (CORTEF) 20 MG tablet Take 20 mg by mouth daily. 08/03/18   [provider]    Allergies Patient has no known allergies.  No family history on file.  Social History Social History   Tobacco Use   Smoking status: Never   Smokeless tobacco: Never  Substance Use Topics   Alcohol use: Yes   Drug use: No     Review of Systems  Constitutional: No fever/chills Eyes:  No discharge ENT: No upper respiratory complaints. Respiratory: no cough. No SOB/ use of accessory muscles to  breath Gastrointestinal:   No nausea, no vomiting.  No diarrhea.  No constipation. Musculoskeletal: Negative for musculoskeletal pain. Skin: Negative for rash, abrasions, lacerations, ecchymosis.    ____________________________________________   PHYSICAL EXAM:  VITAL SIGNS: ED Triage Vitals  Enc Vitals Group     BP 07/14/21 1809 (!) 135/91     Pulse Rate 07/14/21 1809 98     Resp 07/14/21 1809 20     Temp 07/14/21 1809 98.5 F (36.9 C)     Temp Source 07/14/21 1809 Oral     SpO2 07/14/21 1809 99 %     Weight 07/14/21 1810 150 lb (68 kg)     Height 07/14/21 1810 5\' 9"  (1.753 m)     Head Circumference --      Peak Flow --      Pain Score 07/14/21 1810 5     Pain Loc --      Pain Edu? --      Excl. in GC? --      Constitutional: Alert and oriented. Well appearing and in no acute distress. Eyes: Conjunctivae are normal. PERRL. EOMI. Head: Atraumatic. ENT:      Nose: No congestion/rhinnorhea.      Mouth/Throat: Mucous membranes are moist.  Neck: No stridor.  No cervical spine tenderness to palpation. Cardiovascular: Normal rate, regular rhythm. Normal S1 and S2.  Good peripheral circulation. Respiratory: Normal respiratory effort without tachypnea or retractions. Lungs CTAB. Good air entry to the bases with no decreased or absent breath sounds  Gastrointestinal: Bowel sounds x 4 quadrants. Soft and nontender to palpation. No guarding or rigidity. No distention. Musculoskeletal: Full range of motion to all extremities. No obvious deformities noted Neurologic:  Normal for age. No gross focal neurologic deficits are appreciated.  Skin:  Skin is warm, dry and intact. No rash noted. Psychiatric: Mood and affect are normal for age. Speech and behavior are normal.   ____________________________________________   LABS (all labs ordered are listed, but only abnormal results are displayed)  Labs Reviewed  CBC - Abnormal; Notable for the following components:      Result Value    MCHC 36.6 (*)    All other components within normal limits  BASIC METABOLIC PANEL - Abnormal; Notable for the following components:   Sodium 133 (*)    Potassium 3.4 (*)    All other components within normal limits  URINALYSIS, COMPLETE (UACMP) WITH MICROSCOPIC - Abnormal; Notable for the following components:   Color, Urine YELLOW (*)    APPearance HAZY (*)    Ketones, ur 80 (*)    Protein, ur 100 (*)    All other components within normal limits  RESP PANEL BY RT-PCR (FLU A&B, COVID) ARPGX2   ____________________________________________  EKG   ____________________________________________  RADIOLOGY   No results found.  ____________________________________________    PROCEDURES  Procedure(s) performed:     Procedures     Medications - No data to display   ____________________________________________   INITIAL IMPRESSION / ASSESSMENT AND PLAN / ED COURSE  Pertinent labs & imaging results that were available during my care of the patient were reviewed by me and considered in my medical decision making (see chart for details).  Clinical Course as of 07/14/21 1928  Wed Jul 14, 2021  1839 Sodium(!): 133 [JW]    Clinical Course User Index [JW] Orvil Feil, [place]     Assessment and plan:  Chills  25 year old male presents to the emergency department with concern for chills for the past 2 to 3 days.  Vital signs are reassuring at triage.  On physical exam, patient was alert, active and nontoxic-appearing  CBC and CMP were reassuring with.  COVID-19 and influenza testing are in process at this time. Recommended follow up with Endocrinology as needed.   Return precautions were given to return with new or worsening symptoms.    ____________________________________________  FINAL CLINICAL IMPRESSION(S) / ED DIAGNOSES  Final diagnoses:  Chills      NEW MEDICATIONS STARTED DURING THIS VISIT:  ED Discharge Orders     None            This chart was dictated using voice recognition software/Dragon. Despite best efforts to proofread, errors can occur which can change the meaning. Any change was purely unintentional.     Orvil Feil, PA-C 07/14/21 Ninfa Linden    Jene Every, MD 07/14/21 2007

## 2021-11-15 ENCOUNTER — Emergency Department
Admission: EM | Admit: 2021-11-15 | Discharge: 2021-11-15 | Disposition: A | Discharge status: Home / Self Care | Payer: Self-pay | Attending: Emergency Medicine | Admitting: Emergency Medicine

## 2021-11-15 ENCOUNTER — Other Ambulatory Visit: Payer: Self-pay

## 2021-11-15 DIAGNOSIS — Z5321 Procedure and treatment not carried out due to patient leaving prior to being seen by health care provider: Secondary | ICD-10-CM | POA: Insufficient documentation

## 2021-11-15 DIAGNOSIS — M79642 Pain in left hand: Secondary | ICD-10-CM | POA: Insufficient documentation

## 2021-11-15 NOTE — ED Triage Notes (Signed)
Pt comes with c/o left hand pain. Pt states he did punch the door week ago. Pt states pain to knuckles.

## 2021-11-15 NOTE — ED Provider Triage Note (Signed)
Emergency Medicine Provider Triage Evaluation Note  Ralph Ross , a 26 y.o. male  was evaluated in triage.  Pt complains of left hand pain.  Punched a wall over a week ago.  Continues to have pain.  Review of Systems  Positive: Left hand pain Negative: No numbness or tingling  Physical Exam  There were no vitals taken for this visit. Gen:   Awake, no distress   Resp:  Normal effort  MSK:   Moves extremities without difficulty  Other:    Medical Decision Making  Medically screening exam initiated at 1:23 PM.  Appropriate orders placed.  Ralph Ross was informed that the remainder of the evaluation will be completed by another provider, this initial triage assessment does not replace that evaluation, and the importance of remaining in the ED until their evaluation is complete.     Faythe Ghee, PA-C 11/15/21 1323

## 2021-11-16 ENCOUNTER — Emergency Department
Admission: EM | Admit: 2021-11-16 | Discharge: 2021-11-16 | Disposition: A | Discharge status: Home / Self Care | Payer: Self-pay | Attending: Emergency Medicine | Admitting: Emergency Medicine

## 2021-11-16 ENCOUNTER — Emergency Department: Payer: Self-pay

## 2021-11-16 ENCOUNTER — Encounter: Payer: Self-pay | Admitting: Emergency Medicine

## 2021-11-16 ENCOUNTER — Other Ambulatory Visit: Payer: Self-pay

## 2021-11-16 DIAGNOSIS — W228XXA Striking against or struck by other objects, initial encounter: Secondary | ICD-10-CM | POA: Insufficient documentation

## 2021-11-16 DIAGNOSIS — S60222A Contusion of left hand, initial encounter: Secondary | ICD-10-CM | POA: Insufficient documentation

## 2021-11-16 MED ORDER — NAPROXEN 500 MG PO TABS: 500.0000 mg | ORAL_TABLET | Freq: Two times a day (BID) | ORAL | 0 refills | Status: AC

## 2021-11-16 NOTE — ED Provider Notes (Signed)
[facility] Provider Note    None    (approximate)   History   Hand Injury   HPI  Ralph Ross is a 26 y.o. male presents to the ED with complaint of left hand pain.  Patient states that he punched something approximately 2 weeks ago and then reinjured it over the weekend.  Patient reports that he is taken an NSAID over-the-counter infrequently with minimal relief.  He continues to have pain around his third MP joint.  Patient reports he has a history of Addison's disease.  He rates his pain as 7 out of 10.      Physical Exam   Triage Vital Signs: ED Triage Vitals  Enc Vitals Group     BP 11/16/21 0546 129/87     Pulse Rate 11/16/21 0546 82     Resp 11/16/21 0546 18     Temp 11/16/21 0546 98 F (36.7 C)     Temp Source 11/16/21 0546 Oral     SpO2 11/16/21 0546 100 %     Weight 11/16/21 0548 145 lb (65.8 kg)     Height 11/16/21 0548 5\' 9"  (1.753 m)     Head Circumference --      Peak Flow --      Pain Score 11/16/21 0548 7     Pain Loc --      Pain Edu? --      Excl. in GC? --     Most recent vital signs: Vitals:   11/16/21 0546  BP: 129/87  Pulse: 82  Resp: 18  Temp: 98 F (36.7 C)  SpO2: 100%     General: Awake, no distress.  CV:  Good peripheral perfusion.  Heart regular rate and rhythm. Resp:  Normal effort.  Lungs are clear bilaterally. Abd:  No distention.  Other:  On examination of left hand there is some minimal soft tissue edema around the third metacarpal joint.  Skin is intact.  No erythema or ecchymosis present.  Patient is able to flex and extend his digit without any difficulty.  Capillary refills less than 3 seconds.  Radial pulse present.   ED Results / Procedures / Treatments   Labs (all labs ordered are listed, but only abnormal results are displayed) Labs Reviewed - No data to display   RADIOLOGY Left hand x-ray was reviewed and no fractures were seen.  Radiology report reviewed and no acute  findings.  Patient has a lunate triquetral coalition.    PROCEDURES:  Critical Care performed:   Procedures   MEDICATIONS ORDERED IN ED: Medications - No data to display   IMPRESSION / MDM / ASSESSMENT AND PLAN / ED COURSE  I reviewed the triage vital signs and the nursing notes.   Differential diagnosis includes, but is not limited to, fracture left hand, contusion, dislocation.  26 year old male with reported Addison's disease presents to the ED with complaint of left hand pain.  Patient states that he has had hand pain for couple weeks after punching something and then over the weekend reinjured it.  He is taken over-the-counter NSAIDs with minimal relief.  Physical exam does show swelling around the third metacarpal distally.  X-rays were negative for acute injury.  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  Patient was reassured with x-ray results.  He was placed on naproxen 500 mg twice daily with food for the next 10 days.  He is to follow-up with his PCP  if any continued problems.       FINAL CLINICAL IMPRESSION(S) / ED DIAGNOSES   Final diagnoses:  Contusion of left hand, initial encounter     Rx / DC Orders   ED Discharge Orders          Ordered    naproxen (NAPROSYN) 500 MG tablet  2 times daily with meals        11/16/21 0742             Note:  This document was prepared using Dragon voice recognition software and may include unintentional dictation errors.   Tommi Rumps, PA-C 11/16/21 4580    Delton Prairie, MD 11/16/21 1515

## 2021-11-16 NOTE — ED Triage Notes (Signed)
Patient ambulatory to triage with steady gait, without difficulty or distress noted; pt reports that he punched something couple wks ago;pt was here yesterday but left before being seen; c/o left hand pain

## 2021-11-16 NOTE — Discharge Instructions (Addendum)
Follow-up with your primary care provider if any continued problems or concerns.  Ice and elevation if needed for swelling.  A prescription for naproxen 500 mg twice daily with food was sent to your pharmacy.  Begin taking that today and for the next 10 days.  You do not have a fracture in the place that is swollen and should continue to improve.

## 2021-11-16 NOTE — ED Notes (Signed)
See triage note  presents with left hand injury  states he had an injury a few weeks ago  then hurt it again this weekend  pain is across the knuckles

## 2023-04-29 IMAGING — DX DG HAND COMPLETE 3+V*L*
3 series · 3 of 3 positions shown · non-contrast
Comparison: None.

CLINICAL DATA: Left hand pain.  Punching injury a few weeks ago.

EXAM:
LEFT HAND - COMPLETE 3+ VIEW

[hand ap]
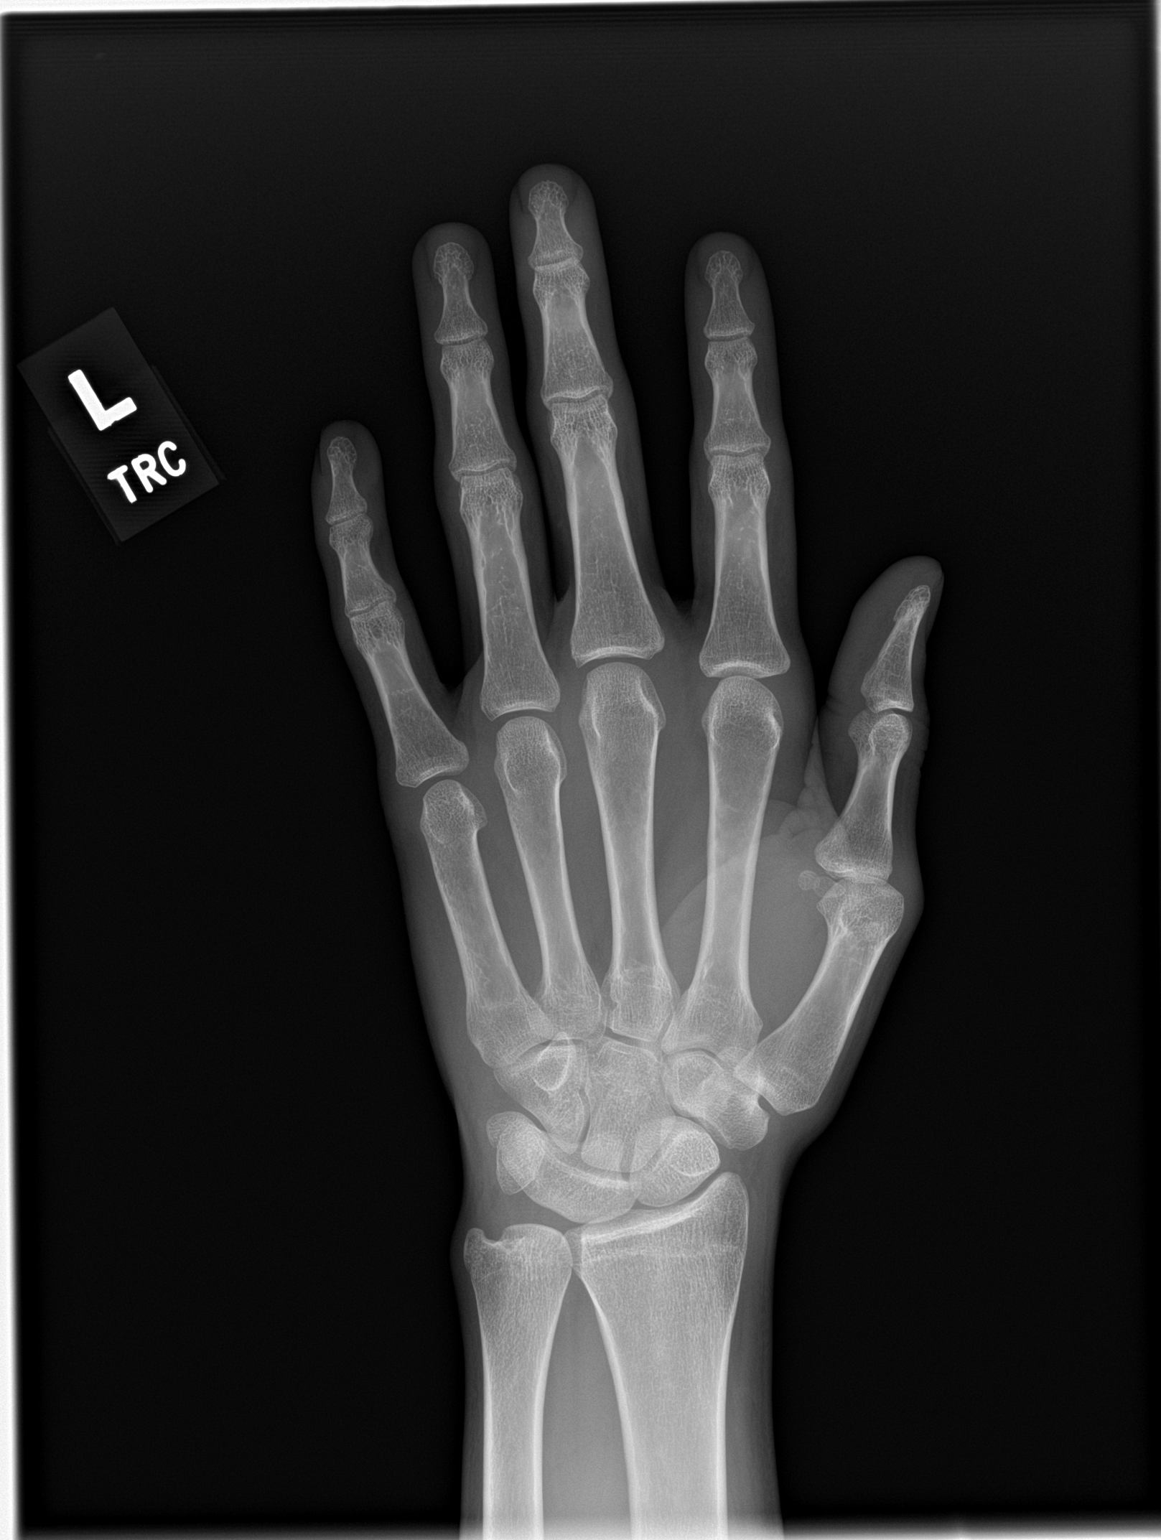

[hand obl]
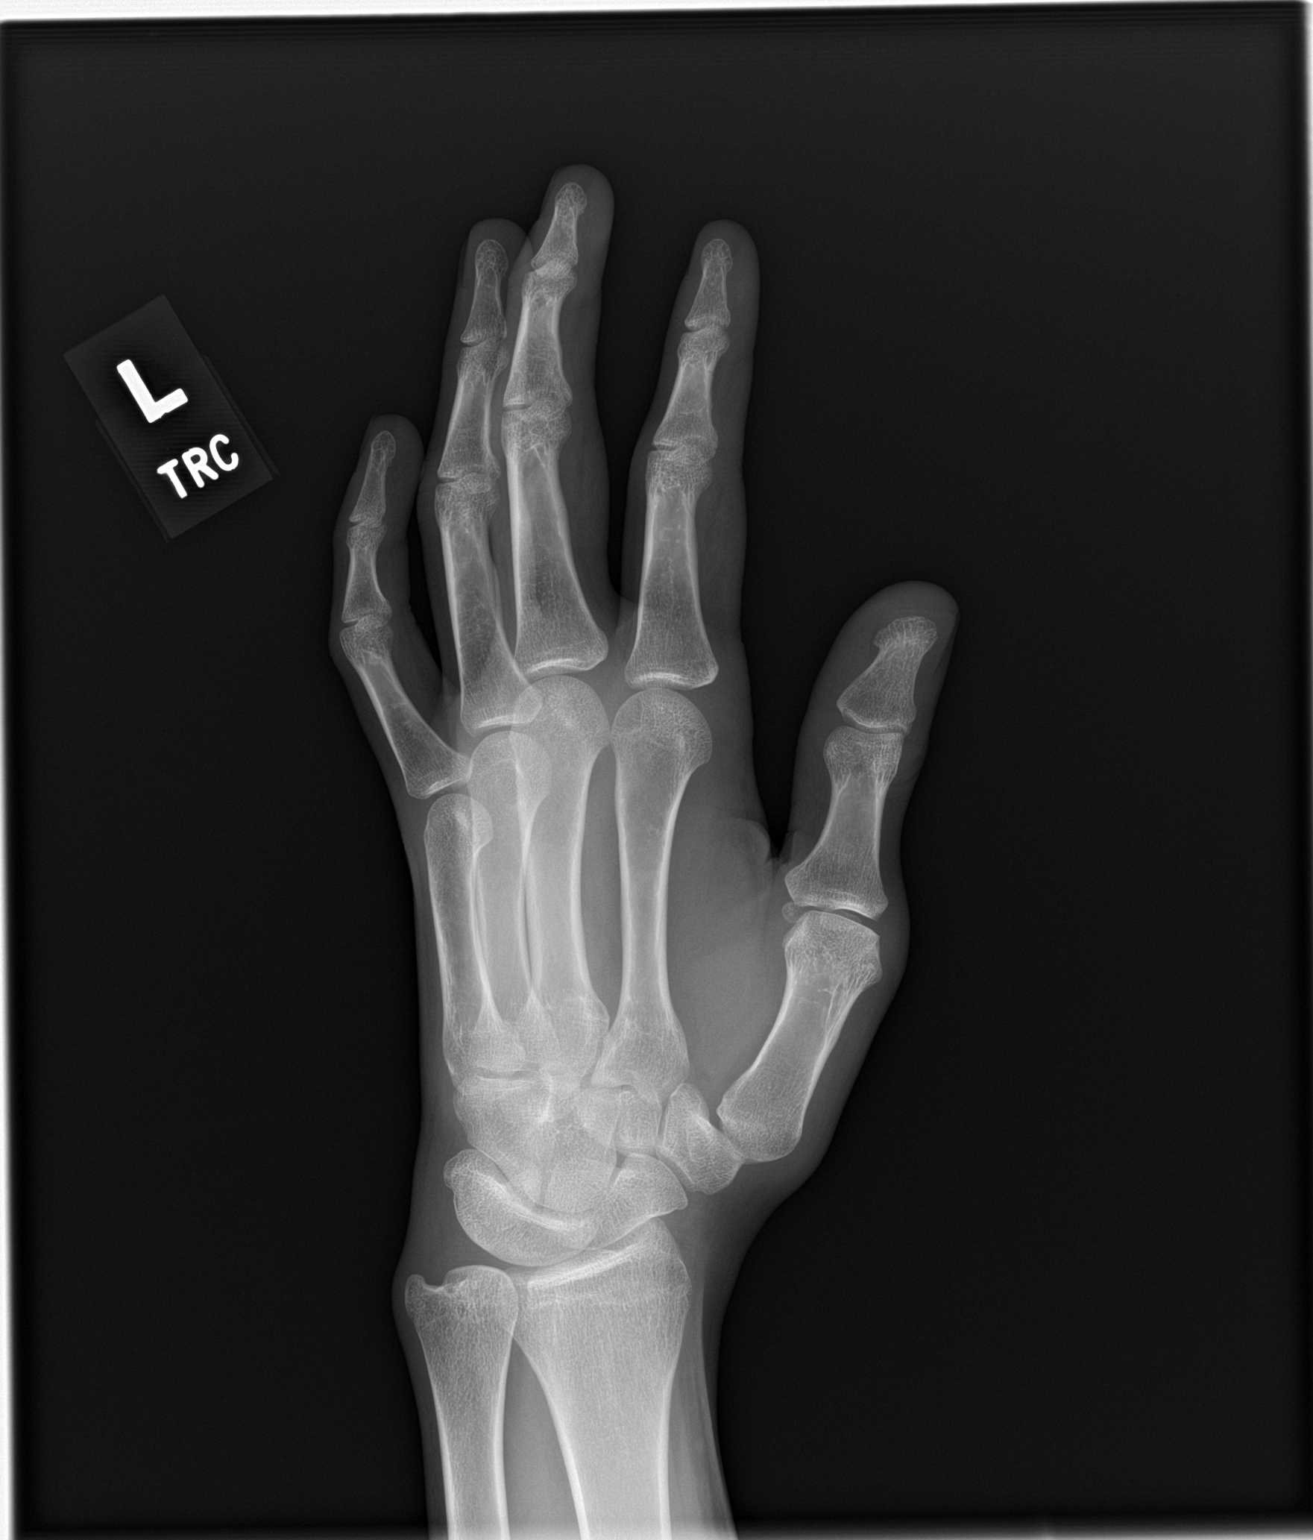

[hand lat]
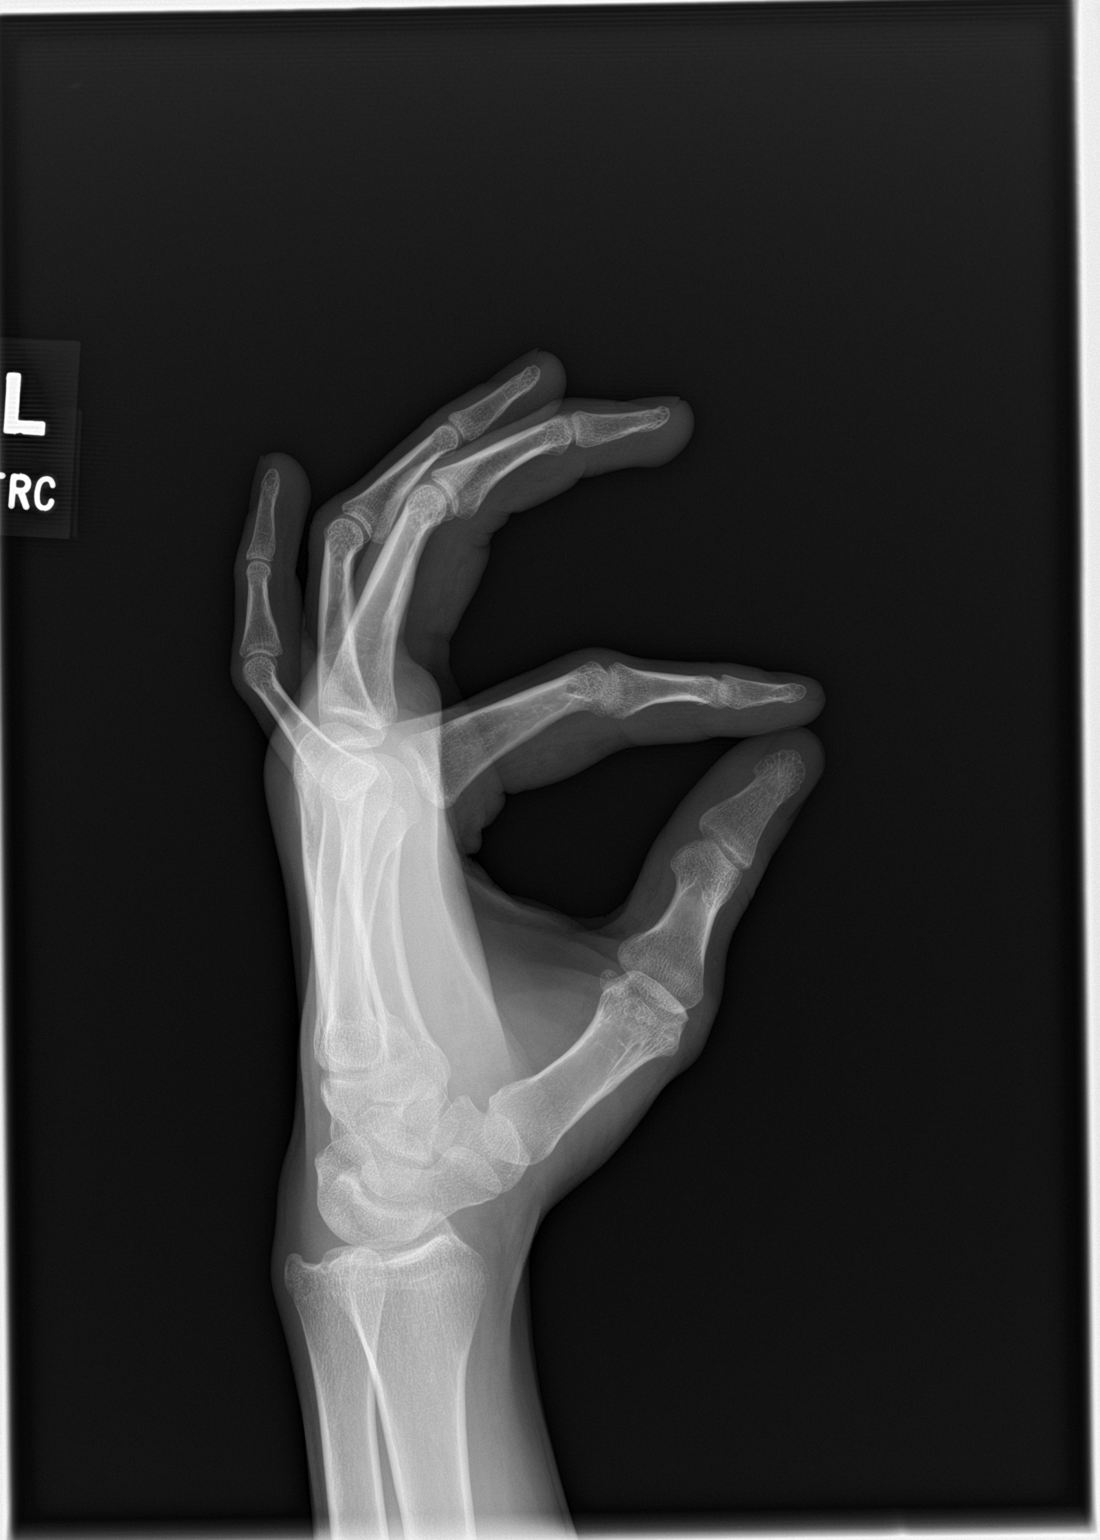

[3 of 3 positions shown; findings below may reference images not displayed]

FINDINGS: There is no evidence of fracture or dislocation.

Lunate triquetral coalition.
IMPRESSION: No acute finding.

Lunate triquetral coalition.
